# Patient Record
Sex: Male | Born: 1960 | Race: Black or African American | Hispanic: No | Marital: Single | Smoking: Current every day smoker
Health system: Southern US, Community
[De-identification: ages and names within clinical notes are randomized; demographics above are authoritative.]

## PROBLEM LIST (undated history)

## (undated) DIAGNOSIS — H5440 Blindness, one eye, unspecified eye: Secondary | ICD-10-CM

## (undated) DIAGNOSIS — Z970 Presence of artificial eye: Secondary | ICD-10-CM

## (undated) HISTORY — PX: OTHER SURGICAL HISTORY: SHX169

---

## 1997-09-16 ENCOUNTER — Other Ambulatory Visit: Admission: RE | Admit: 1997-09-16 | Discharge: 1997-09-16 | Payer: Self-pay | Admitting: Ophthalmology

## 2000-07-08 ENCOUNTER — Emergency Department (HOSPITAL_COMMUNITY): Admission: EM | Admit: 2000-07-08 | Discharge: 2000-07-08 | Payer: Self-pay | Admitting: Emergency Medicine

## 2000-08-11 ENCOUNTER — Emergency Department (HOSPITAL_COMMUNITY): Admission: EM | Admit: 2000-08-11 | Discharge: 2000-08-11 | Payer: Self-pay | Admitting: Emergency Medicine

## 2000-12-20 ENCOUNTER — Emergency Department (HOSPITAL_COMMUNITY): Admission: EM | Admit: 2000-12-20 | Discharge: 2000-12-20 | Payer: Self-pay | Admitting: Emergency Medicine

## 2001-06-15 ENCOUNTER — Emergency Department (HOSPITAL_COMMUNITY): Admission: EM | Admit: 2001-06-15 | Discharge: 2001-06-15 | Payer: Self-pay | Admitting: Emergency Medicine

## 2001-08-05 ENCOUNTER — Emergency Department (HOSPITAL_COMMUNITY): Admission: EM | Admit: 2001-08-05 | Discharge: 2001-08-05 | Payer: Self-pay

## 2001-08-10 ENCOUNTER — Emergency Department (HOSPITAL_COMMUNITY): Admission: EM | Admit: 2001-08-10 | Discharge: 2001-08-10 | Payer: Self-pay | Admitting: *Deleted

## 2010-06-12 ENCOUNTER — Emergency Department (HOSPITAL_COMMUNITY)
Admission: EM | Admit: 2010-06-12 | Discharge: 2010-06-12 | Payer: Self-pay | Source: Home / Self Care | Admitting: Emergency Medicine

## 2010-12-05 ENCOUNTER — Observation Stay (HOSPITAL_COMMUNITY)
Admission: EM | Admit: 2010-12-05 | Discharge: 2010-12-06 | Disposition: A | Discharge status: Home / Self Care | Payer: Self-pay | Attending: Ophthalmology | Admitting: Ophthalmology

## 2010-12-05 ENCOUNTER — Emergency Department (HOSPITAL_COMMUNITY): Payer: Self-pay

## 2010-12-05 DIAGNOSIS — Z01810 Encounter for preprocedural cardiovascular examination: Secondary | ICD-10-CM | POA: Insufficient documentation

## 2010-12-05 DIAGNOSIS — Z01818 Encounter for other preprocedural examination: Secondary | ICD-10-CM | POA: Insufficient documentation

## 2010-12-05 DIAGNOSIS — H31329 Choroidal rupture, unspecified eye: Secondary | ICD-10-CM | POA: Insufficient documentation

## 2010-12-05 DIAGNOSIS — W219XXA Striking against or struck by unspecified sports equipment, initial encounter: Secondary | ICD-10-CM | POA: Insufficient documentation

## 2010-12-05 DIAGNOSIS — T85398A Other mechanical complication of other ocular prosthetic devices, implants and grafts, initial encounter: Secondary | ICD-10-CM | POA: Insufficient documentation

## 2010-12-05 DIAGNOSIS — S0520XA Ocular laceration and rupture with prolapse or loss of intraocular tissue, unspecified eye, initial encounter: Principal | ICD-10-CM | POA: Insufficient documentation

## 2010-12-05 DIAGNOSIS — Y998 Other external cause status: Secondary | ICD-10-CM | POA: Insufficient documentation

## 2010-12-05 DIAGNOSIS — Y9364 Activity, baseball: Secondary | ICD-10-CM | POA: Insufficient documentation

## 2010-12-05 DIAGNOSIS — F17200 Nicotine dependence, unspecified, uncomplicated: Secondary | ICD-10-CM | POA: Insufficient documentation

## 2010-12-05 DIAGNOSIS — Z01812 Encounter for preprocedural laboratory examination: Secondary | ICD-10-CM | POA: Insufficient documentation

## 2010-12-05 LAB — CBC
HCT: 39.4 % (ref 39.0–52.0)
Hemoglobin: 14.5 g/dL (ref 13.0–17.0)
MCH: 33.9 pg (ref 26.0–34.0)
MCV: 92.1 fL (ref 78.0–100.0)
Platelets: 246 10*3/uL (ref 150–400)
RBC: 4.28 MIL/uL (ref 4.22–5.81)

## 2010-12-05 LAB — DIFFERENTIAL
Eosinophils Absolute: 0 10*3/uL (ref 0.0–0.7)
Lymphocytes Relative: 11 % — ABNORMAL LOW (ref 12–46)
Lymphs Abs: 1.3 10*3/uL (ref 0.7–4.0)
Monocytes Relative: 9 % (ref 3–12)
Neutrophils Relative %: 79 % — ABNORMAL HIGH (ref 43–77)

## 2010-12-05 LAB — POCT I-STAT, CHEM 8
BUN: 12 mg/dL (ref 6–23)
Calcium, Ion: 1.03 mmol/L — ABNORMAL LOW (ref 1.12–1.32)
Chloride: 104 mEq/L (ref 96–112)
Creatinine, Ser: 1.3 mg/dL (ref 0.50–1.35)
Glucose, Bld: 153 mg/dL — ABNORMAL HIGH (ref 70–99)
HCT: 46 % (ref 39.0–52.0)
Hemoglobin: 15.6 g/dL (ref 13.0–17.0)
Potassium: 4.1 mEq/L (ref 3.5–5.1)
Sodium: 136 mEq/L (ref 135–145)
TCO2: 18 mmol/L (ref 0–100)

## 2010-12-16 NOTE — Op Note (Signed)
  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  PROCEDURE PERFORMED:  Suturing of corneal transplant and repositing intraocular content composed of hemorrhagic choroidal.  SURGEON:  Shade Flood, MD.  ESTIMATED BLOOD LOSS:  Less than 1 mL.  There were no complications and no specimens for pathology.  The patient was prepared and draped in the usual fashion for ocular surgery on the left eye and a solid lid speculum was placed.  The patient was noted to have a three-quarter rupture of the corneal transplant with exposed iris and hemorrhagic choroidal tissue behind the flap of the cornea.  This was extruding through the opening in the host portion of the cornea.  Care was taken to gently reposit the choroidal detachment using a blunt instrumentation.  Then, the corneal graft was re-apposed with interrupted 10-0 nylon sutures.  Once sufficient sutures were placed to establish the anterior chamber, Viscoat viscoelastic was placed through the graft incision to deepen the chamber and separate the choroidal from the cornea.  There was no lost iris tissue evident and no lost retina tissue evident, though there was clearly a massive choroidal noted.  After completion of suturing of the corneal graft, subconjunctival Ancef was done with a 26-gauge needle with the needle under direct visualization, concentration delivered 100 mg.  The patient was also given posterior sub-Tenon triamcinolone acetate 40 mg.  The patient was given a  retrobulbar block of 4 mL 0.75% bupivacaine at the close of the procedure.  The eye was patched with TobraDex ointment and a metal shield was placed.  The patient was uneventfully extubated and transferred from the operating room to the postoperative recovery area.          ______________________________ Shade Flood, MD     GG/MEDQ  D:  12/06/2010  T:  12/06/2010  Job:  [id number]  Electronically Signed by Shade Flood MD on 12/16/2010 11:57:35 AM

## 2011-06-17 ENCOUNTER — Encounter (HOSPITAL_COMMUNITY): Payer: Self-pay

## 2011-06-17 ENCOUNTER — Emergency Department (HOSPITAL_COMMUNITY)
Admission: EM | Admit: 2011-06-17 | Discharge: 2011-06-17 | Disposition: A | Discharge status: Home / Self Care | Payer: Self-pay | Attending: Emergency Medicine | Admitting: Emergency Medicine

## 2011-06-17 DIAGNOSIS — T169XXA Foreign body in ear, unspecified ear, initial encounter: Secondary | ICD-10-CM | POA: Insufficient documentation

## 2011-06-17 DIAGNOSIS — H9209 Otalgia, unspecified ear: Secondary | ICD-10-CM | POA: Insufficient documentation

## 2011-06-17 DIAGNOSIS — IMO0002 Reserved for concepts with insufficient information to code with codable children: Secondary | ICD-10-CM | POA: Insufficient documentation

## 2011-06-17 DIAGNOSIS — T162XXA Foreign body in left ear, initial encounter: Secondary | ICD-10-CM

## 2011-06-17 MED ORDER — NEOMYCIN-POLYMYXIN-HC 3.5-10000-1 OT SUSP: 4.0000 [drp] | Freq: Three times a day (TID) | OTIC | Status: AC

## 2011-06-17 NOTE — ED Provider Notes (Signed)
History     CSN: [id number]  Arrival date & time 06/17/11  4098   First MD Initiated Contact with Patient 06/17/11 [phone]      Chief Complaint  Patient presents with  . Otalgia    (Consider location/radiation/quality/duration/timing/severity/associated sxs/prior treatment) HPI Comments: Pt was using a q tip to clean his ear and since then he has felt like a portion of the cotton has been in his ear.  When he takes a shower that left ear stays wet.  Patient is a 51 y.o. male presenting with ear pain. The history is provided by the patient.  Otalgia This is a new problem. Episode onset: several days ago. There is pain in the left ear. The problem occurs constantly. The problem has not changed since onset.There has been no fever. The patient is experiencing no pain. Pertinent negatives include no ear discharge, no hearing loss, no rhinorrhea and no cough.    History reviewed. No pertinent past medical history.  Past Surgical History  Procedure Date  . Ruptured eye     eye consultants have done surgery on the left eye.     History reviewed. No pertinent family history.  History  Substance Use Topics  . Smoking status: Current Some Day Smoker  . Smokeless tobacco: Not on file  . Alcohol Use: Yes      Review of Systems  HENT: Positive for ear pain. Negative for hearing loss, rhinorrhea and ear discharge.   Respiratory: Negative for cough.   All other systems reviewed and are negative.    Allergies  Review of patient's allergies indicates no known allergies.  Home Medications  No current outpatient prescriptions on file.  BP 154/84  Pulse 64  Temp(Src) 97.7 F (36.5 C) (Oral)  Resp 20  SpO2 100%  Physical Exam  Nursing note and vitals reviewed. Constitutional: He appears well-developed and well-nourished. No distress.  HENT:  Head: Normocephalic and atraumatic.  Right Ear: External ear normal.       White cotton fb left ear  Eyes: Right eye exhibits no  discharge. Left eye exhibits no discharge. No scleral icterus.  Neck: Neck supple. No tracheal deviation present.  Cardiovascular: Normal rate.   Pulmonary/Chest: Effort normal. No stridor. No respiratory distress.  Musculoskeletal: He exhibits no edema.  Neurological: He is alert. Cranial nerve deficit: no gross deficits.  Skin: Skin is warm and dry. No rash noted.  Psychiatric: He has a normal mood and affect.    ED Course  FOREIGN BODY REMOVAL Performed by: Linwood Dibbles R Authorized by: Linwood Dibbles R Consent: Verbal consent obtained. Written consent not obtained. Risks and benefits: risks, benefits and alternatives were discussed Consent given by: patient Time out: Immediately prior to procedure a "time out" was called to verify the correct patient, procedure, equipment, support staff and site/side marked as required. Body area: ear Location details: left ear Patient sedated: no Patient restrained: no Patient cooperative: yes Localization method: ENT speculum and visualized Removal mechanism: ear scoop and alligator forceps Complexity: complex 1 objects recovered. Objects recovered: q tip cotton swab Post-procedure assessment: foreign body removed Patient tolerance: Patient tolerated the procedure well with no immediate complications.   (including critical care time)  Labs Reviewed - No data to display No results found.   1. Foreign body of left ear       MDM  Q tip removed.  Several attempts were necessary.  No bleeding noted post removal.  abx drops to the canal post removal.  Celene Kras, MD 06/17/11 [phone]

## 2011-06-17 NOTE — ED Notes (Signed)
Pt was using q-tips and he thinks that part of it broke off into his left ear. Pt is having pain in the left ear. He has tried to place oil and use tweezers to get it out with no relief.

## 2011-06-17 NOTE — ED Notes (Signed)
Alligator forceps at pt bedside.

## 2011-06-17 NOTE — ED Notes (Signed)
Dr. Lynelle Doctor at bedside for FB removal from left ear.

## 2011-11-02 ENCOUNTER — Emergency Department (HOSPITAL_COMMUNITY)
Admission: EM | Admit: 2011-11-02 | Discharge: 2011-11-02 | Disposition: A | Discharge status: Home / Self Care | Payer: Self-pay | Attending: Emergency Medicine | Admitting: Emergency Medicine

## 2011-11-02 ENCOUNTER — Encounter (HOSPITAL_COMMUNITY): Payer: Self-pay | Admitting: Emergency Medicine

## 2011-11-02 DIAGNOSIS — H02829 Cysts of unspecified eye, unspecified eyelid: Secondary | ICD-10-CM | POA: Insufficient documentation

## 2011-11-02 DIAGNOSIS — H579 Unspecified disorder of eye and adnexa: Secondary | ICD-10-CM | POA: Insufficient documentation

## 2011-11-02 DIAGNOSIS — Z9689 Presence of other specified functional implants: Secondary | ICD-10-CM | POA: Insufficient documentation

## 2011-11-02 DIAGNOSIS — F17200 Nicotine dependence, unspecified, uncomplicated: Secondary | ICD-10-CM | POA: Insufficient documentation

## 2011-11-02 NOTE — ED Notes (Signed)
PT presents with small bump on left eyelid that has no drainage; been there since March; not painful.

## 2011-11-02 NOTE — ED Provider Notes (Signed)
History     CSN: [id number]  Arrival date & time 11/02/11  9562   First MD Initiated Contact with Patient 11/02/11 [phone]      Chief Complaint  Patient presents with  . Eye Problem    (Consider location/radiation/quality/duration/timing/severity/associated sxs/prior treatment) HPI Comments: Patient has a nontender nodule to the left upper eyelid for the past 3 months. She had a globe rupture of this eye back in March and has a prosthetic eye that was placed by Dr. Clarisa Kindred. His right eye vision is normal. He denies any fever, vomiting, drainage, bleeding. The bump is at times tender in varies in size. It is not involving the lid margin.  The history is provided by the patient.    No past medical history on file.  Past Surgical History  Procedure Date  . Ruptured eye     eye consultants have done surgery on the left eye.     No family history on file.  History  Substance Use Topics  . Smoking status: Current Some Day Smoker  . Smokeless tobacco: Not on file  . Alcohol Use: Yes      Review of Systems  HENT: Negative for congestion and rhinorrhea.   Eyes: Negative for discharge and visual disturbance.  Respiratory: Negative for cough, chest tightness and shortness of breath.   Cardiovascular: Negative for chest pain.  Gastrointestinal: Negative for nausea, vomiting and abdominal pain.  Genitourinary: Negative for dysuria and hematuria.  Musculoskeletal: Negative for back pain.  Neurological: Negative for headaches.    Allergies  Review of patient's allergies indicates no known allergies.  Home Medications  No current outpatient prescriptions on file.  BP 130/80  Pulse 78  Temp 98.4 F (36.9 C) (Oral)  Resp 16  SpO2 99%  Physical Exam  Constitutional: He is oriented to person, place, and time. He appears well-developed and well-nourished. No distress.  HENT:  Head: Normocephalic and atraumatic.  Mouth/Throat: Oropharynx is clear and moist. No oropharyngeal  exudate.  Eyes: Conjunctivae and EOM are normal. Pupils are equal, round, and reactive to light.       0.5 cm nontender nodule to L upper lid.  Not involving lid margin.  No fluctuance or erythema  Neck: Normal range of motion. Neck supple.  Cardiovascular: Normal rate, regular rhythm and normal heart sounds.   No murmur heard. Pulmonary/Chest: Effort normal and breath sounds normal. No respiratory distress.  Abdominal: Soft. There is no tenderness. There is no rebound and no guarding.  Musculoskeletal: Normal range of motion. He exhibits no edema and no tenderness.  Neurological: He is alert and oriented to person, place, and time. No cranial nerve deficit.  Skin: Skin is warm.    ED Course  Procedures (including critical care time)  Labs Reviewed - No data to display No results found.   No diagnosis found.    MDM  Nodule to L upper eyelid, likely cyst.  No evidence of fluctuance or abscess. No change in vision.   Warm compresses, follow up ophthalmologist      Glynn Octave, MD 11/02/11 [phone]

## 2011-11-02 NOTE — ED Notes (Signed)
PT states he can not eye anything out of his LEFT eye. Everything is black.

## 2011-11-02 NOTE — Discharge Instructions (Signed)
You have a cyst in your eyelid that needs to be removed by a specialist.  Follow up with the ophthalmologist.  Return to the ED if you develop new or worsening symptoms.

## 2011-11-02 NOTE — ED Notes (Signed)
Pt reports bump on left eyelid since March. States had consult with eye doctor, told he needed to have it drained, but could not afford it. Reports intermittant pain.

## 2012-02-05 ENCOUNTER — Emergency Department (HOSPITAL_COMMUNITY)
Admission: EM | Admit: 2012-02-05 | Discharge: 2012-02-05 | Disposition: A | Discharge status: Home / Self Care | Payer: Self-pay | Attending: Emergency Medicine | Admitting: Emergency Medicine

## 2012-02-05 ENCOUNTER — Encounter (HOSPITAL_COMMUNITY): Payer: Self-pay | Admitting: Emergency Medicine

## 2012-02-05 DIAGNOSIS — R42 Dizziness and giddiness: Secondary | ICD-10-CM | POA: Insufficient documentation

## 2012-02-05 DIAGNOSIS — F17200 Nicotine dependence, unspecified, uncomplicated: Secondary | ICD-10-CM | POA: Insufficient documentation

## 2012-02-05 LAB — CBC WITH DIFFERENTIAL/PLATELET
Eosinophils Absolute: 0.4 10*3/uL (ref 0.0–0.7)
Eosinophils Relative: 7 % — ABNORMAL HIGH (ref 0–5)
HCT: 45 % (ref 39.0–52.0)
Hemoglobin: 15.9 g/dL (ref 13.0–17.0)
Lymphocytes Relative: 32 % (ref 12–46)
Lymphs Abs: 1.8 10*3/uL (ref 0.7–4.0)
MCH: 33.8 pg (ref 26.0–34.0)
MCV: 95.7 fL (ref 78.0–100.0)
Monocytes Absolute: 0.6 10*3/uL (ref 0.1–1.0)
Monocytes Relative: 11 % (ref 3–12)
RBC: 4.7 MIL/uL (ref 4.22–5.81)

## 2012-02-05 LAB — BASIC METABOLIC PANEL
BUN: 8 mg/dL (ref 6–23)
CO2: 24 mEq/L (ref 19–32)
Calcium: 9.7 mg/dL (ref 8.4–10.5)
GFR calc non Af Amer: >90 mL/min (ref >90)
Glucose, Bld: 104 mg/dL — ABNORMAL HIGH (ref 70–99)

## 2012-02-05 LAB — URINALYSIS, ROUTINE W REFLEX MICROSCOPIC
Bilirubin Urine: NEGATIVE
Glucose, UA: NEGATIVE mg/dL
Hgb urine dipstick: NEGATIVE
Protein, ur: NEGATIVE mg/dL
Urobilinogen, UA: 0.2 mg/dL (ref 0.0–1.0)

## 2012-02-05 MED ORDER — LORAZEPAM 1 MG PO TABS
0.5000 mg | ORAL_TABLET | Freq: Once | ORAL | Status: AC
  Administered 2012-02-05: 0.5 mg via ORAL
  Filled 2012-02-05: qty 1

## 2012-02-05 MED ORDER — MECLIZINE HCL 25 MG PO TABS: 25.0000 mg | ORAL_TABLET | Freq: Four times a day (QID) | ORAL | Status: DC | PRN

## 2012-02-05 MED ORDER — MECLIZINE HCL 25 MG PO TABS
25.0000 mg | ORAL_TABLET | Freq: Once | ORAL | Status: AC
  Administered 2012-02-05: 25 mg via ORAL
  Filled 2012-02-05: qty 1

## 2012-02-05 NOTE — ED Notes (Signed)
Patient is alert and orientedx4.  Patient is being discharged and all instructions were explained and he had no questions.  Patient has a friend coming to transport him home.

## 2012-02-05 NOTE — ED Notes (Signed)
Patient says he has been dizzy for like a week and a half and he says that he is just tired of being dizzy.  Patient says he drinks everyday and he has not drank today but he feels like he has been drinking.

## 2012-02-05 NOTE — ED Provider Notes (Signed)
History     CSN: [id number]  Arrival date & time 02/05/12  1516   First MD Initiated Contact with Patient 02/05/12 1741      Chief Complaint  Patient presents with  . Dizziness    (Consider location/radiation/quality/duration/timing/severity/associated sxs/prior treatment) HPI... dizziness and lightheadedness for approximately 10 days. No frank neuro deficits. No fever, sweats, chills, stiff neck.  Patient claims to be healthy.  Severity is mild to moderate.  Nothing makes symptoms better or worse  History reviewed. No pertinent past medical history.  Past Surgical History  Procedure Date  . Ruptured eye     eye consultants have done surgery on the left eye.     History reviewed. No pertinent family history.  History  Substance Use Topics  . Smoking status: Current Some Day Smoker  . Smokeless tobacco: Not on file  . Alcohol Use: Yes      Review of Systems  All other systems reviewed and are negative.    Allergies  Review of patient's allergies indicates no known allergies.  Home Medications   Current Outpatient Rx  Name Route Sig Dispense Refill  . ACETAMINOPHEN 325 MG PO TABS Oral Take 650 mg by mouth every 6 (six) hours as needed. For pain    . MECLIZINE HCL 25 MG PO TABS Oral Take 1 tablet (25 mg total) by mouth every 6 (six) hours as needed for dizziness. 20 tablet 0    BP 108/80  Pulse 76  Temp 98.3 F (36.8 C) (Oral)  Resp 20  SpO2 99%  Physical Exam  Nursing note and vitals reviewed. Constitutional: He is oriented to person, place, and time. He appears well-developed and well-nourished.  HENT:  Head: Normocephalic and atraumatic.  Eyes: Conjunctivae normal and EOM are normal. Pupils are equal, round, and reactive to light.  Neck: Normal range of motion. Neck supple.  Cardiovascular: Normal rate, regular rhythm and normal heart sounds.   Pulmonary/Chest: Effort normal and breath sounds normal.  Abdominal: Soft. Bowel sounds are normal.    Musculoskeletal: Normal range of motion.  Neurological: He is alert and oriented to person, place, and time.  Skin: Skin is warm and dry.  Psychiatric: He has a normal mood and affect.    ED Course  Procedures (including critical care time)  Labs Reviewed  CBC WITH DIFFERENTIAL - Abnormal; Notable for the following:    Eosinophils Relative 7 (*)     All other components within normal limits  BASIC METABOLIC PANEL - Abnormal; Notable for the following:    Glucose, Bld 104 (*)     All other components within normal limits  URINALYSIS, ROUTINE W REFLEX MICROSCOPIC - Abnormal; Notable for the following:    APPearance CLOUDY (*)     All other components within normal limits   No results found.   1. Vertigo       MDM  Physical exam grossly normal. Discharged home on Antivert.  Patient will return if worse        Donnetta Hutching, MD 02/05/12 2122

## 2012-02-05 NOTE — ED Notes (Signed)
Pt c/o dizziness and lightheadedness x 1.5 weeks

## 2013-12-27 ENCOUNTER — Emergency Department (HOSPITAL_COMMUNITY)
Admission: EM | Admit: 2013-12-27 | Discharge: 2013-12-27 | Disposition: A | Discharge status: Home / Self Care | Payer: Self-pay | Attending: Emergency Medicine | Admitting: Emergency Medicine

## 2013-12-27 ENCOUNTER — Encounter (HOSPITAL_COMMUNITY): Payer: Self-pay | Admitting: Emergency Medicine

## 2013-12-27 DIAGNOSIS — T169XXA Foreign body in ear, unspecified ear, initial encounter: Secondary | ICD-10-CM | POA: Insufficient documentation

## 2013-12-27 DIAGNOSIS — H61891 Other specified disorders of right external ear: Secondary | ICD-10-CM

## 2013-12-27 DIAGNOSIS — IMO0002 Reserved for concepts with insufficient information to code with codable children: Secondary | ICD-10-CM | POA: Insufficient documentation

## 2013-12-27 DIAGNOSIS — Y929 Unspecified place or not applicable: Secondary | ICD-10-CM | POA: Insufficient documentation

## 2013-12-27 DIAGNOSIS — Y9389 Activity, other specified: Secondary | ICD-10-CM | POA: Insufficient documentation

## 2013-12-27 DIAGNOSIS — F17200 Nicotine dependence, unspecified, uncomplicated: Secondary | ICD-10-CM | POA: Insufficient documentation

## 2013-12-27 DIAGNOSIS — Z0389 Encounter for observation for other suspected diseases and conditions ruled out: Secondary | ICD-10-CM | POA: Insufficient documentation

## 2013-12-27 NOTE — ED Notes (Signed)
Sanders, PA at bedside for evaluation. 

## 2013-12-27 NOTE — ED Provider Notes (Signed)
CSN: [id number]     Arrival date & time 12/27/13  [id number] History   First MD Initiated Contact with Patient 12/27/13 0747     Chief Complaint  Patient presents with  . Foreign Body in Ear     (Consider location/radiation/quality/duration/timing/severity/associated sxs/prior Treatment) The history is provided by the patient and medical records.   This is a 53 y.o. M presenting to the ED for a retained FB in right ear canal.  Patient states he was cleaning his ears after he showered last night and states the cotton end of the q-tip was missing when he pulled it out of his ear, he is concerned it may be stuck in his right ear.  Patient states his ear feels normal, hearing normal but he just wants it checked.  History reviewed. No pertinent past medical history. Past Surgical History  Procedure Laterality Date  . Ruptured eye      eye consultants have done surgery on the left eye.    No family history on file. History  Substance Use Topics  . Smoking status: Current Some Day Smoker  . Smokeless tobacco: Not on file  . Alcohol Use: Yes    Review of Systems  HENT:       FB sensation in right ear  All other systems reviewed and are negative.     Allergies  Review of patient's allergies indicates no known allergies.  Home Medications   Prior to Admission medications   Medication Sig Start Date End Date Taking? Authorizing Provider  acetaminophen (TYLENOL) 325 MG tablet Take 650 mg by mouth every 6 (six) hours as needed. For pain    Historical Provider, MD  meclizine (ANTIVERT) 25 MG tablet Take 1 tablet (25 mg total) by mouth every 6 (six) hours as needed for dizziness. 02/05/12   Donnetta HutchingBrian Cook, MD   BP 135/80  Pulse 62  Temp(Src) 97.8 F (36.6 C) (Oral)  Resp 18  SpO2 100%  Physical Exam  Nursing note and vitals reviewed. Constitutional: He is oriented to person, place, and time. He appears well-developed and well-nourished. No distress.  HENT:  Head: Normocephalic and  atraumatic.  Right Ear: Tympanic membrane and ear canal normal. No foreign bodies.  Left Ear: Tympanic membrane and ear canal normal.  Nose: Nose normal.  Mouth/Throat: Oropharynx is clear and moist.  No FB visible in right ear canal, small amount of wax but TM clearly visible and is intact Left ear WNL  Eyes: Conjunctivae and EOM are normal. Pupils are equal, round, and reactive to light.  Neck: Normal range of motion. Neck supple.  Cardiovascular: Normal rate, regular rhythm and normal heart sounds.   Pulmonary/Chest: Effort normal and breath sounds normal. No respiratory distress. He has no wheezes.  Musculoskeletal: Normal range of motion. He exhibits no edema.  Neurological: He is alert and oriented to person, place, and time.  Skin: Skin is warm and dry. He is not diaphoretic.  Psychiatric: He has a normal mood and affect.    ED Course  Procedures (including critical care time) Labs Review Labs Reviewed - No data to display  Imaging Review No results found.   EKG Interpretation None      MDM   Final diagnoses:  Foreign body sensation in ear canal, right   No FB visible in either ear canal, TMs clearly visible and are intact.  Patient discharged home in stable condition.  Garlon HatchetLisa M Sanders, PA-C 12/27/13 [phone]

## 2013-12-27 NOTE — Discharge Instructions (Signed)
Avoid sticking q-tip in your ears, this can damage your ear-drum. Return to the ED for new concerns.

## 2013-12-27 NOTE — ED Notes (Signed)
Patient states was cleaning ear with qtip yesterday and part of it was left in ear.

## 2013-12-28 NOTE — ED Provider Notes (Signed)
Medical screening examination/treatment/procedure(s) were performed by non-physician practitioner and as supervising physician I was immediately available for consultation/collaboration.  Audree CamelScott T Kazuki Ingle, MD 12/28/13 [phone]

## 2014-07-11 ENCOUNTER — Encounter (HOSPITAL_COMMUNITY): Payer: Self-pay | Admitting: Emergency Medicine

## 2014-07-11 ENCOUNTER — Emergency Department (HOSPITAL_COMMUNITY)
Admission: EM | Admit: 2014-07-11 | Discharge: 2014-07-11 | Disposition: A | Discharge status: Home / Self Care | Payer: Self-pay | Attending: Emergency Medicine | Admitting: Emergency Medicine

## 2014-07-11 DIAGNOSIS — L749 Eccrine sweat disorder, unspecified: Secondary | ICD-10-CM | POA: Insufficient documentation

## 2014-07-11 DIAGNOSIS — Z720 Tobacco use: Secondary | ICD-10-CM | POA: Insufficient documentation

## 2014-07-11 DIAGNOSIS — L02411 Cutaneous abscess of right axilla: Secondary | ICD-10-CM | POA: Insufficient documentation

## 2014-07-11 DIAGNOSIS — L748 Other eccrine sweat disorders: Secondary | ICD-10-CM

## 2014-07-11 MED ORDER — SULFAMETHOXAZOLE-TRIMETHOPRIM 800-160 MG PO TABS
1.0000 | ORAL_TABLET | Freq: Once | ORAL | Status: AC
  Administered 2014-07-11: 1 via ORAL
  Filled 2014-07-11: qty 1

## 2014-07-11 MED ORDER — SULFAMETHOXAZOLE-TRIMETHOPRIM 800-160 MG PO TABS: 1.0000 | ORAL_TABLET | Freq: Two times a day (BID) | ORAL | Status: DC

## 2014-07-11 MED ORDER — SULFAMETHOXAZOLE-TRIMETHOPRIM 200-40 MG/5ML PO SUSP: 20.0000 mL | Freq: Once | ORAL | Status: DC

## 2014-07-11 MED ORDER — LIDOCAINE-EPINEPHRINE (PF) 2 %-1:200000 IJ SOLN
20.0000 mL | Freq: Once | INTRAMUSCULAR | Status: AC
  Administered 2014-07-11: 20 mL via INTRADERMAL
  Filled 2014-07-11: qty 20

## 2014-07-11 NOTE — ED Notes (Signed)
Patient with three areas of tenderness with pus coming from them.  They are tender to the touch, warm to the touch.  All three are in the right axilla.

## 2014-07-11 NOTE — ED Provider Notes (Signed)
CSN: [id number]     Arrival date & time 07/11/14  [id number] History   First MD Initiated Contact with Patient 07/11/14 [phone]     Chief Complaint  Patient presents with  . Abscess     (Consider location/radiation/quality/duration/timing/severity/associated sxs/prior Treatment) HPI   Gary Gill is a 54 y.o. male complaining of 3 painful lesions to right axilla worsening over the course of 2 days. Patient has been "trying to mash" done), now. He's had no prior similar episodes. He denies fever, chills, nausea, vomiting, history of diabetes or HIV. Pain is severe, 8 out of 10, no pain medication taken prior to arrival. He states the pain is exacerbated by movement and palpation.  History reviewed. No pertinent past medical history. Past Surgical History  Procedure Laterality Date  . Ruptured eye      eye consultants have done surgery on the left eye.    No family history on file. History  Substance Use Topics  . Smoking status: Current Some Day Smoker  . Smokeless tobacco: Not on file  . Alcohol Use: Yes    Review of Systems  10 systems reviewed and found to be negative, except as noted in the HPI.   Allergies  Review of patient's allergies indicates no known allergies.  Home Medications   Prior to Admission medications   Medication Sig Start Date End Date Taking? Authorizing Provider  acetaminophen (TYLENOL) 325 MG tablet Take 650 mg by mouth every 6 (six) hours as needed. For pain    Historical Provider, MD  meclizine (ANTIVERT) 25 MG tablet Take 1 tablet (25 mg total) by mouth every 6 (six) hours as needed for dizziness. 02/05/12   Donnetta HutchingBrian Cook, MD   BP 111/65 mmHg  Pulse 75  Temp(Src) 98 F (36.7 C) (Oral)  Resp 16  Ht 5\' 7"  (1.702 m)  SpO2 100% Physical Exam  Constitutional: He is oriented to person, place, and time. He appears well-developed and well-nourished. No distress.  HENT:  Head: Normocephalic and atraumatic.  Mouth/Throat: Oropharynx is clear and moist.   Eyes: Conjunctivae and EOM are normal.  Cardiovascular: Normal rate, regular rhythm and intact distal pulses.   Pulmonary/Chest: Effort normal. No stridor.  Musculoskeletal: Normal range of motion.  Neurological: He is alert and oriented to person, place, and time.  Skin:  2 fluctuant abscesses to right axilla 1 actively draining, one with no fluctuance.  Psychiatric: He has a normal mood and affect.  Nursing note and vitals reviewed.   ED Course  INCISION AND DRAINAGE Date/Time: 07/11/2014 7:59 AM Performed by: Wynetta EmeryPISCIOTTA, Raine Blodgett Authorized by: Wynetta EmeryPISCIOTTA, Netanel Yannuzzi Consent: Verbal consent obtained. Consent given by: patient Patient identity confirmed: verbally with patient Type: abscess Location: Right axilla. Anesthesia: local infiltration Local anesthetic: lidocaine 2% with epinephrine Anesthetic total: 1.5 ml Patient sedated: no Scalpel size: 11 Incision type: single straight Drainage: purulent Drainage amount: scant Wound treatment: wound left open Packing material: none Patient tolerance: Patient tolerated the procedure well with no immediate complications    INCISION AND DRAINAGE Date/Time: 07/11/2014 7:59 AM Performed by: Wynetta EmeryPISCIOTTA, Kalanie Fewell Authorized by: Wynetta EmeryPISCIOTTA, Trevar Boehringer Consent: Verbal consent obtained. Consent given by: patient Patient identity confirmed: verbally with patient Type: abscess Location: Right axilla. Anesthesia: local infiltration Local anesthetic: lidocaine 2% with epinephrine Anesthetic total: 1.5 ml Patient sedated: no Scalpel size: 11 Incision type: single straight Drainage: purulent Drainage amount: scant Wound treatment: wound left open Packing material: none Patient tolerance: Patient tolerated the procedure well with no immediate complications  Labs Review Labs  Reviewed - No data to display  Imaging Review No results found.   EKG Interpretation None      MDM   Final diagnoses:  None   Filed Vitals:   07/11/14 0649   BP: 111/65  Pulse: 75  Temp: 98 F (36.7 C)  TempSrc: Oral  Resp: 16  Height:  (1.702 m)  SpO2: 100%    Medications  lidocaine-EPINEPHrine (XYLOCAINE W/EPI) 2 %-1:200000 (PF) injection 20 mL (not administered)  sulfamethoxazole-trimethoprim (BACTRIM DS,SEPTRA DS) 800-160 MG per tablet 1 tablet (not administered)    Gary Gill is a pleasant 54 y.o. male presenting with several abscesses to right axilla, no signs of systemic infection. To the more I indeed with scant drainage. Patient advised wound care. Will start on antibiotics, advised warm compresses.  Evaluation does not show pathology that would require ongoing emergent intervention or inpatient treatment. Pt is hemodynamically stable and mentating appropriately. Discussed findings and plan with patient/guardian, who agrees with care plan. All questions answered. Return precautions discussed and outpatient follow up given.   New Prescriptions   SULFAMETHOXAZOLE-TRIMETHOPRIM (BACTRIM DS) 800-160 MG PER TABLET    Take 1 tablet by mouth 2 (two) times daily.         Wynetta Emery, PA-C 07/11/14 0801  Nelia Shi, MD 07/11/14 [phone]

## 2014-07-11 NOTE — Discharge Instructions (Signed)
If you see signs of infection (warmth, redness, tenderness, pus, sharp increase in pain, fever, red streaking) immediately return to the emergency department.  Take your antibiotics as directed and to completion. You should never have any leftover antibiotics! Push fluids and stay well hydrated.    If you develop fever, have vomiting or if the swelling and redness starts spreading , return to the emergency room immediately for a recheck.

## 2016-06-19 ENCOUNTER — Encounter (HOSPITAL_COMMUNITY): Payer: Self-pay

## 2016-06-19 ENCOUNTER — Emergency Department (HOSPITAL_COMMUNITY): Payer: No Typology Code available for payment source

## 2016-06-19 ENCOUNTER — Emergency Department (HOSPITAL_COMMUNITY)
Admission: EM | Admit: 2016-06-19 | Discharge: 2016-06-19 | Disposition: A | Discharge status: Home / Self Care | Payer: No Typology Code available for payment source | Attending: Emergency Medicine | Admitting: Emergency Medicine

## 2016-06-19 DIAGNOSIS — S199XXA Unspecified injury of neck, initial encounter: Secondary | ICD-10-CM | POA: Diagnosis present

## 2016-06-19 DIAGNOSIS — S39012A Strain of muscle, fascia and tendon of lower back, initial encounter: Secondary | ICD-10-CM | POA: Diagnosis not present

## 2016-06-19 DIAGNOSIS — Y939 Activity, unspecified: Secondary | ICD-10-CM | POA: Diagnosis not present

## 2016-06-19 DIAGNOSIS — Y999 Unspecified external cause status: Secondary | ICD-10-CM | POA: Insufficient documentation

## 2016-06-19 DIAGNOSIS — S161XXA Strain of muscle, fascia and tendon at neck level, initial encounter: Secondary | ICD-10-CM

## 2016-06-19 DIAGNOSIS — Z79899 Other long term (current) drug therapy: Secondary | ICD-10-CM | POA: Insufficient documentation

## 2016-06-19 DIAGNOSIS — Y92410 Unspecified street and highway as the place of occurrence of the external cause: Secondary | ICD-10-CM | POA: Diagnosis not present

## 2016-06-19 DIAGNOSIS — F17200 Nicotine dependence, unspecified, uncomplicated: Secondary | ICD-10-CM | POA: Diagnosis not present

## 2016-06-19 HISTORY — DX: Presence of artificial eye: Z97.0

## 2016-06-19 HISTORY — DX: Blindness, one eye, unspecified eye: H54.40

## 2016-06-19 MED ORDER — DICLOFENAC SODIUM 50 MG PO TBEC: 50.0000 mg | DELAYED_RELEASE_TABLET | Freq: Two times a day (BID) | ORAL | 0 refills | Status: DC

## 2016-06-19 MED ORDER — CYCLOBENZAPRINE HCL 10 MG PO TABS: 10.0000 mg | ORAL_TABLET | Freq: Two times a day (BID) | ORAL | 0 refills | Status: DC | PRN

## 2016-06-19 MED ORDER — CYCLOBENZAPRINE HCL 10 MG PO TABS
10.0000 mg | ORAL_TABLET | Freq: Once | ORAL | Status: AC
  Administered 2016-06-19: 10 mg via ORAL
  Filled 2016-06-19: qty 1

## 2016-06-19 MED ORDER — IBUPROFEN 400 MG PO TABS
600.0000 mg | ORAL_TABLET | Freq: Once | ORAL | Status: AC
  Administered 2016-06-19: 600 mg via ORAL
  Filled 2016-06-19: qty 1

## 2016-06-19 NOTE — ED Notes (Signed)
Pt verbalized understanding discharge instructions and denies any further needs or questions at this time. VS stable, ambulatory and steady gait.   
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
## 2016-06-19 NOTE — ED Notes (Signed)
Signature pad broken and pt in a rush.

## 2017-02-23 ENCOUNTER — Encounter (INDEPENDENT_AMBULATORY_CARE_PROVIDER_SITE_OTHER): Payer: Self-pay | Admitting: Physician Assistant

## 2017-02-23 ENCOUNTER — Ambulatory Visit (INDEPENDENT_AMBULATORY_CARE_PROVIDER_SITE_OTHER): Payer: Medicaid Other | Admitting: Physician Assistant

## 2017-02-23 VITALS — BP 115/75 | HR 90 | Temp 97.6°F | Wt 159.6 lb

## 2017-02-23 DIAGNOSIS — L989 Disorder of the skin and subcutaneous tissue, unspecified: Secondary | ICD-10-CM | POA: Diagnosis not present

## 2017-02-23 DIAGNOSIS — Z23 Encounter for immunization: Secondary | ICD-10-CM | POA: Diagnosis not present

## 2017-02-23 DIAGNOSIS — N50811 Right testicular pain: Secondary | ICD-10-CM | POA: Diagnosis not present

## 2017-02-23 DIAGNOSIS — Z1159 Encounter for screening for other viral diseases: Secondary | ICD-10-CM

## 2017-02-23 DIAGNOSIS — Z114 Encounter for screening for human immunodeficiency virus [HIV]: Secondary | ICD-10-CM

## 2017-02-23 MED ORDER — CIPROFLOXACIN HCL 500 MG PO TABS: 500.0000 mg | ORAL_TABLET | Freq: Two times a day (BID) | ORAL | 0 refills | Status: DC

## 2017-02-23 MED ORDER — NAPROXEN 500 MG PO TABS: 500.0000 mg | ORAL_TABLET | Freq: Two times a day (BID) | ORAL | 0 refills | Status: DC

## 2017-02-23 NOTE — Progress Notes (Signed)
Subjective:  Patient ID: Gary Gill, male    DOB: 1961-04-12  Age: 56 y.o. MRN: [id number]  CC: groin pain  HPI Gary Gill is a 56 y.o. male with a medical history of blind left eye s/p eye prosthesis presents as a new patient with complaint of pruritic lesions on back of head, chest, and left thigh. Onset one year ago. Has used several OTC creams including hydrocortisone without relief.    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 Right testicle is very tender to any kind of contact. Pain radiates up the spermatic cord.  Does not endorse trauma, swelling, redness, urinary symptoms, or f/c/n/v.      Outpatient Medications Prior to Visit  Medication Sig Dispense Refill  . acetaminophen (TYLENOL) 325 MG tablet Take 650 mg by mouth every 6 (six) hours as needed. For pain    . cyclobenzaprine (FLEXERIL) 10 MG tablet Take 1 tablet (10 mg total) by mouth 2 (two) times daily as needed for muscle spasms. (Patient not taking: Reported on 02/23/2017) 20 tablet 0  . diclofenac (VOLTAREN) 50 MG EC tablet Take 1 tablet (50 mg total) by mouth 2 (two) times daily. (Patient not taking: Reported on 02/23/2017) 15 tablet 0  . meclizine (ANTIVERT) 25 MG tablet Take 1 tablet (25 mg total) by mouth every 6 (six) hours as needed for dizziness. (Patient not taking: Reported on 02/23/2017) 20 tablet 0  . sulfamethoxazole-trimethoprim (BACTRIM DS) 800-160 MG per tablet Take 1 tablet by mouth 2 (two) times daily. (Patient not taking: Reported on 02/23/2017) 20 tablet 0   No facility-administered medications prior to visit.      ROS Review of Systems  Constitutional: Negative for chills, fever and malaise/fatigue.  Eyes: Negative for blurred vision.  Respiratory: Negative for shortness of breath.   Cardiovascular: Negative for chest pain and palpitations.  Gastrointestinal: Negative for abdominal pain and nausea.  Genitourinary:  Negative for dysuria and hematuria.       Testicular pain  Musculoskeletal: Negative for joint pain and myalgias.  Skin: Positive for rash.  Neurological: Negative for tingling and headaches.  Psychiatric/Behavioral: Negative for depression. The patient is not nervous/anxious.     Objective:  BP 115/75 (BP Location: Left Arm, Patient Position: Sitting, Cuff Size: Normal)   Pulse 90   Temp 97.6 F (36.4 C) (Oral)   Wt 159 lb 9.6 oz (72.4 kg)   SpO2 96%   BMI 25.00 kg/m   BP/Weight 02/23/2017 06/19/2016 07/11/2014  Systolic BP 115 136 131  Diastolic BP 75 86 82  Wt. (Lbs) 159.6 160 -  BMI 25 25.06 -      Physical Exam  Constitutional: He is oriented to person, place, and time.  Well developed, central obesity, NAD, polite  HENT:  Head: Normocephalic and atraumatic.  Eyes: Conjunctivae are normal. No scleral icterus.  Neck: Normal range of motion. Neck supple. No thyromegaly present.  Cardiovascular: Normal rate, regular rhythm and normal heart sounds.   Pulmonary/Chest: Effort normal and breath sounds normal.  Abdominal: Soft. Bowel sounds are normal. There is no tenderness.  Genitourinary:  Genitourinary Comments: Normal appearance of testicles. Exquisite tenderness of the right testicle and spermatic cord. No inguinal hernia.   Musculoskeletal: He exhibits no edema.  Neurological: He is alert and oriented to person, place, and time. No cranial nerve deficit. Coordination normal.  Skin: Skin is warm and dry. No rash noted. No erythema. No pallor.  Psychiatric: He has a normal mood and affect. His behavior is normal. Thought content normal.  Vitals reviewed.    Assessment & Plan:    1. Skin lesion - Ambulatory referral to Dermatology  2. Testicular pain, right - Begin naproxen (NAPROSYN) 500 MG tablet; Take 1 tablet (500 mg total) by mouth 2 (two) times daily with a meal.  Dispense: 30 tablet; Refill: 0 - Begin Ciprofloxacin 500 mg BID x10 days. - Wear supportive  underwear - Pt unable to provide urine for UA.  3. Encounter for screening for HIV - HIV antibody  4. Need for hepatitis C screening test - Hepatitis c antibody (reflex)  5. Need for Tdap vaccination - Tdap vaccine greater than or equal to 7yo IM  6. Need for influenza vaccination - Flu Vaccine QUAD 6+ mos PF IM (Fluarix Quad PF)   Meds ordered this encounter  Medications  . naproxen (NAPROSYN) 500 MG tablet    Sig: Take 1 tablet (500 mg total) by mouth 2 (two) times daily with a meal.    Dispense:  30 tablet    Refill:  0    Order Specific Question:   Supervising Provider    Answer:   Quentin Angst [[id number]]    Follow-up: Return in about 4 weeks (around 03/23/2017) for full physical.   Loletta Specter PA

## 2017-02-23 NOTE — Patient Instructions (Signed)
Orchitis Orchitis is swelling (inflammation) of a testicle caused by infection. Testicles are the male organs that produce sperm. The testicles are held in a fleshy sac (scrotum) located behind the penis. Orchitis usually affects only one testicle, but it can occur in both. The condition can develop suddenly. Orchitis can be caused by many different kinds of bacteria and viruses. What are the causes? Orchitis can be caused by either a bacterial or viral infection. Bacterial Infections  These often occur along with an infection of the coiled tube that collects sperm and sits on top of the testicle (epididymis).  In men who are not sexually active, bacterial orchitis usually starts as a urinary tract infection and spreads to the testicle.  In sexually active men, sexually transmitted infections are the most common cause of bacterial orchitis. These can include: ? Gonorrhea. ? Chlamydia. Viral Infections  Mumps is still the most common cause of viral orchitis, though mumps is now rare in many areas because of vaccination.  Other viruses that can cause orchitis include: ? The chickenpox virus (varicella-zoster virus). ? The virus that causes mononucleosis (Epstein-Barr virus). What increases the risk? Boys and men who have not been vaccinated against mumps are at risk for mumps orchitis. Risk factors for bacterial orchitis include:  Frequent urinary tract infections.  High-risk sexual behaviors.  Having a sexual partner with a sexually transmitted infection.  Having had urinary tract surgery.  Using a tube passed through the penis to drain urine (Foley catheter).  An enlarged prostate gland.  What are the signs or symptoms? The most common symptoms of orchitis are swelling and pain in the scrotum. Other signs and symptoms may include:  Feeling generally sick (malaise).  Fever and chills.  Painful urination.  Painful ejaculation.  Blood or discharge from the  penis.  Nausea.  Headache.  Fatigue.  How is this diagnosed? Your health care provider may suspect orchitis if you have a painful, swollen testicle along with other signs and symptoms of the condition. A physical exam will be done. Tests may also be done to help your health care provider make a diagnosis. These may include:  A blood test to check for signs of infection.  A urine test to check for a urinary tract infection.  Using a swab to collect a fluid sample from the tip of the penis to test for sexually transmitted infections.  Taking an image of the testicle using sound waves and a computer (testicular ultrasound).  How is this treated? Treatment of orchitis depends on the cause. For orchitis caused by a bacterial infection, your health care provider will most likely prescribe antibiotic medicines. Bacterial infections usually clear up within a few days. Both viral infections and bacterial infections may be treated with:  Bed rest.  Anti-inflammatory medicines.  Pain medicines.  Elevating the scrotum and applying ice.  Follow these instructions at home:  Rest as directed by your health care provider.  Take medicines only as directed by your health care provider.  If you were prescribed an antibiotic medicine, finish it all even if you start to feel better.  Elevate your scrotum and apply ice as directed: ? Put ice in a plastic bag. ? Place a small towel or pillow between your legs. ? Rest your scrotum on the pillow or towel. ? Place another towel between your skin and the plastic bag. ? Leave the ice on for 20 minutes, 2-3 times a day. Contact a health care provider if:  You have a  fever.  Pain and swelling have not gotten better after 3 days. Get help right away if:  Your pain is getting worse.  The swelling in your testicle gets worse. This information is not intended to replace advice given to you by your health care provider. Make sure you discuss any  questions you have with your health care provider. Document Released: 04/30/2000 Document Revised: 10/09/2015 Document Reviewed: 09/20/2013 Elsevier Interactive Patient Education  2018 [employer].

## 2017-02-24 LAB — HIV ANTIBODY (ROUTINE TESTING W REFLEX): HIV Screen 4th Generation wRfx: NONREACTIVE

## 2017-02-24 LAB — HEPATITIS C ANTIBODY (REFLEX)

## 2017-02-24 LAB — HCV COMMENT:

## 2017-02-25 ENCOUNTER — Encounter (INDEPENDENT_AMBULATORY_CARE_PROVIDER_SITE_OTHER): Payer: Self-pay

## 2017-02-25 ENCOUNTER — Telehealth (INDEPENDENT_AMBULATORY_CARE_PROVIDER_SITE_OTHER): Payer: Self-pay

## 2017-02-25 NOTE — Telephone Encounter (Signed)
-----   Message from Loletta Specter, PA-C sent at 02/24/2017  8:38 AM EDT ----- HIV and HCV negative.

## 2017-02-25 NOTE — Telephone Encounter (Signed)
Left message asking patient to call the office. Mailed results. Maryjean Morn, CMA

## 2017-03-23 ENCOUNTER — Encounter (INDEPENDENT_AMBULATORY_CARE_PROVIDER_SITE_OTHER): Payer: Self-pay | Admitting: Physician Assistant

## 2017-03-23 ENCOUNTER — Ambulatory Visit (INDEPENDENT_AMBULATORY_CARE_PROVIDER_SITE_OTHER): Payer: Medicaid Other | Admitting: Physician Assistant

## 2017-03-23 VITALS — BP 134/88 | HR 78 | Temp 97.9°F | Ht 67.5 in | Wt 165.0 lb

## 2017-03-23 DIAGNOSIS — G8929 Other chronic pain: Secondary | ICD-10-CM

## 2017-03-23 DIAGNOSIS — Z125 Encounter for screening for malignant neoplasm of prostate: Secondary | ICD-10-CM | POA: Diagnosis not present

## 2017-03-23 DIAGNOSIS — Z0000 Encounter for general adult medical examination without abnormal findings: Secondary | ICD-10-CM

## 2017-03-23 DIAGNOSIS — M25511 Pain in right shoulder: Secondary | ICD-10-CM

## 2017-03-23 DIAGNOSIS — R350 Frequency of micturition: Secondary | ICD-10-CM

## 2017-03-23 DIAGNOSIS — L989 Disorder of the skin and subcutaneous tissue, unspecified: Secondary | ICD-10-CM

## 2017-03-23 LAB — POCT URINALYSIS DIPSTICK
BILIRUBIN UA: NEGATIVE
GLUCOSE UA: NEGATIVE
Ketones, UA: NEGATIVE
Nitrite, UA: NEGATIVE
Protein, UA: NEGATIVE
SPEC GRAV UA: 1.015 (ref 1.010–1.025)
Urobilinogen, UA: 0.2 E.U./dL
pH, UA: 7 (ref 5.0–8.0)

## 2017-03-23 MED ORDER — CLOTRIMAZOLE 1 % EX CREA: 1.0000 "application " | TOPICAL_CREAM | Freq: Two times a day (BID) | CUTANEOUS | 0 refills | Status: DC

## 2017-03-23 MED ORDER — NAPROXEN 500 MG PO TABS: 500.0000 mg | ORAL_TABLET | Freq: Two times a day (BID) | ORAL | 0 refills | Status: DC

## 2017-03-23 NOTE — Patient Instructions (Signed)
Prostate-Specific Antigen Test Why am I having this test? The prostate-specific antigen (PSA) test is performed to determine how much PSA you have in your blood. PSA is a type of protein that is normally present in the prostate gland. Certain conditions can cause PSA blood levels to increase, such as:  Infection in the prostate (prostatitis).  Enlargement of the prostate (hypertrophy).  Prostate cancer.  Because PSA levels increase greatly from prostate cancer, this test can be used to confirm a diagnosis of prostate cancer. It may also be used to monitor treatment for prostate cancer and to watch for a return of prostate cancer after treatment has finished. This test has a very high false-positive rate. Therefore, routine PSA screening for all men is no longer recommended. A false-positive result is incorrect because it indicates a condition or finding is present when it is not. What kind of sample is taken? A blood sample is required for this test. It is usually collected by inserting a needle into a vein or by sticking a finger with a small needle. How do I prepare for this test? There is no preparation required for this test. However, there are factors that can affect the results of a PSA test. To get the most accurate results:  Avoid having a rectal exam within several hours before having your blood drawn for this test.  Avoid having any procedures performed on the prostate gland within 6 weeks of having this test.  Avoid ejaculating within 24 hours of having this test.  Tell your health care provider if you had a recent urinary tract infection (UTI).  Tell your health care provider if you are taking medicines to assist with hair growth, such as finasteride.  Tell your health care provider if you have been exposed to a medicine called diethylstilbestrol.  Let your health care provider know if any of these factors apply to you. You may be asked to reschedule the test. What are the  reference ranges? Reference ranges are established after testing a large group of people. Reference ranges may vary among different people, labs, and hospitals. It is your responsibility to obtain your test results. Ask the lab or department performing the test when and how you will get your results.  Low: 0-2.5 ng/mL.  Slightly to moderately elevated: 2.6-10.0 ng/mL.  Moderately elevated: 10.0-19.9 ng/mL.  Significantly elevated: 20 ng/mL or greater.  What do the results mean? PSA test results greater than 4 ng/mL are found in the majority of men with prostate cancer. If your test result is above this level, this can indicate an increased risk for prostate cancer. Increased PSA levels can also indicate other health conditions. Talk with your health care provider to discuss your results, treatment options, and if necessary, the need for more tests. Talk with your health care provider if you have any questions about your results. Talk with your health care provider to discuss your results, treatment options, and if necessary, the need for more tests. Talk with your health care provider if you have any questions about your results. This information is not intended to replace advice given to you by your health care provider. Make sure you discuss any questions you have with your health care provider. Document Released: 06/05/2004 Document Revised: 01/07/2016 Document Reviewed: 09/26/2013 Elsevier Interactive Patient Education  2018 [employer]  

## 2017-03-23 NOTE — Addendum Note (Signed)
Addended by: Sindy MessingGOMEZ, Laticia Vannostrand D on: 03/23/2017 04:47 PM   Modules accepted: Orders

## 2017-03-23 NOTE — Progress Notes (Addendum)
Subjective:  Patient ID: Gary Gill, male    DOB: 03-May-1961  Age: 56 y.o. MRN: [id number]  CC: annual exam  HPI  Gary Gill is a 56 y.o. male with a medical history of blind left eye s/p eye prosthesis presents for an annual physical exam. 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 Right shoulder pain onset approximately two years ago. Worse with activities at work such as lifting above shoulder. Pain when laying on affected shoulder. Left shoulder pain also but less than right shoulder. Back pain and bilateral knee pain also chronic and bothersome. Complains of skin lesions on scalp that are pruritic at times. Uses neosporin with no relief. Complains of urinary hesitancy. No dysuria, genital lesions. Requests prostate cancer screening. Does not endorse any other symptoms or complaints.     Outpatient Medications Prior to Visit  Medication Sig Dispense Refill  . acetaminophen (TYLENOL) 325 MG tablet Take 650 mg by mouth every 6 (six) hours as needed. For pain    . naproxen (NAPROSYN) 500 MG tablet Take 1 tablet (500 mg total) by mouth 2 (two) times daily with a meal. (Patient not taking: Reported on 03/23/2017) 30 tablet 0  . ciprofloxacin (CIPRO) 500 MG tablet Take 1 tablet (500 mg total) by mouth 2 (two) times daily. 20 tablet 0   No facility-administered medications prior to visit.      ROS Review of Systems  Constitutional: Negative for chills, fever and malaise/fatigue.  Eyes: Negative for blurred vision.  Respiratory: Negative for shortness of breath.   Cardiovascular: Negative for chest pain and palpitations.  Gastrointestinal: Negative for abdominal pain and nausea.  Genitourinary: Negative for dysuria and hematuria.       Urinary hesitancy   Musculoskeletal: Positive for back pain and joint pain. Negative for myalgias.  Skin: Positive for itching and rash.  Neurological: Negative for tingling and  headaches.  Psychiatric/Behavioral: Negative for depression. The patient is not nervous/anxious.     Objective:  BP 134/88 (BP Location: Left Arm, Patient Position: Sitting, Cuff Size: Normal)   Pulse 78   Temp 97.9 F (36.6 C) (Oral)   Ht 5' 7.5" (1.715 m)   Wt 165 lb (74.8 kg)   SpO2 99%   BMI 25.46 kg/m   BP/Weight 03/23/2017 02/23/2017 06/19/2016  Systolic BP 134 115 136  Diastolic BP 88 75 86  Wt. (Lbs) 165 159.6 160  BMI 25.46 25 25.06      Physical Exam  Constitutional: He is oriented to person, place, and time.  Well developed, well nourished, NAD, polite  HENT:  Head: Normocephalic and atraumatic.  Eyes: Conjunctivae and EOM are normal. No scleral icterus.  Right pupil reactive to light  Neck: Normal range of motion. Neck supple. No thyromegaly present.  Cardiovascular: Normal rate, regular rhythm and normal heart sounds.  Pulmonary/Chest: Effort normal and breath sounds normal. No respiratory distress. He has no wheezes. He has no rales.  Abdominal: Soft. Bowel sounds are normal. There is no tenderness.  Genitourinary:  Genitourinary Comments: Prostate approximately 35 g, small nodule felt, nontender, somewhat firm.  Musculoskeletal: He exhibits no edema or deformity.  Pain elicited at the right shoulder at approximately 90 degrees of flexion and abduction. Back with full aROM, however, pain elicited with extension and less so with flexion.  Lymphadenopathy:    He has no cervical adenopathy.  Neurological: He is alert and oriented to person, place, and time. No  cranial nerve deficit. Coordination normal.  Patellar DTR 1+ bilaterally. RUE and LUE strength 5/5 bilaterally. Antalgic gait.  Skin: Skin is warm and dry. No rash noted. No erythema. No pallor.  Few small patch of ovoid lesions with fine scaling on the scalp.  Psychiatric: He has a normal mood and affect. His behavior is normal. Thought content normal.  Vitals reviewed.    Assessment & Plan:        1. Annual physical exam - CBC with Differential - Comprehensive metabolic panel - Lipid panel - Urinalysis Dipstick - PSA  2. Screening for prostate cancer - PSA  3. Skin lesion - Begin clotrimazole 1% cream BID - Already referred to Dermatology at previous visit  4. Chronic right shoulder pain - DG Shoulder Right; Future  5. Urinary frequency - Urinalysis dipstick positive for small leukocytes - Urine culture Meds ordered this encounter  Medications  . clotrimazole (LOTRIMIN) 1 % cream    Sig: Apply 1 application 2 (two) times daily topically.    Dispense:  30 g    Refill:  0    Order Specific Question:   Supervising Provider    Answer:   Quentin AngstJEGEDE, OLUGBEMIGA E [id number]  . naproxen (NAPROSYN) 500 MG tablet    Sig: Take 1 tablet (500 mg total) 2 (two) times daily with a meal by mouth.    Dispense:  30 tablet    Refill:  0    Order Specific Question:   Supervising Provider    Answer:   Quentin AngstJEGEDE, OLUGBEMIGA E [id number]    Follow-up: Return if symptoms worsen or fail to improve, for will call with results.   Loletta Specteroger David Gomez PA

## 2017-03-24 ENCOUNTER — Other Ambulatory Visit (INDEPENDENT_AMBULATORY_CARE_PROVIDER_SITE_OTHER): Payer: Self-pay | Admitting: Physician Assistant

## 2017-03-24 ENCOUNTER — Telehealth (INDEPENDENT_AMBULATORY_CARE_PROVIDER_SITE_OTHER): Payer: Self-pay

## 2017-03-24 DIAGNOSIS — N400 Enlarged prostate without lower urinary tract symptoms: Secondary | ICD-10-CM

## 2017-03-24 LAB — COMPREHENSIVE METABOLIC PANEL
ALBUMIN: 4.8 g/dL (ref 3.5–5.5)
ALT: 26 IU/L (ref 0–44)
AST: 39 IU/L (ref 0–40)
Albumin/Globulin Ratio: 1.7 (ref 1.2–2.2)
Alkaline Phosphatase: 74 IU/L (ref 39–117)
BUN/Creatinine Ratio: 11 (ref 9–20)
BUN: 11 mg/dL (ref 6–24)
Bilirubin Total: 0.3 mg/dL (ref 0.0–1.2)
CALCIUM: 10 mg/dL (ref 8.7–10.2)
CO2: 27 mmol/L (ref 20–29)
CREATININE: 0.96 mg/dL (ref 0.76–1.27)
Chloride: 100 mmol/L (ref 96–106)
GFR calc Af Amer: 102 mL/min/{1.73_m2} (ref >59)
GFR, EST NON AFRICAN AMERICAN: 89 mL/min/{1.73_m2} (ref >59)
GLOBULIN, TOTAL: 2.9 g/dL (ref 1.5–4.5)
GLUCOSE: 97 mg/dL (ref 65–99)
Potassium: 4.8 mmol/L (ref 3.5–5.2)
SODIUM: 140 mmol/L (ref 134–144)
Total Protein: 7.7 g/dL (ref 6.0–8.5)

## 2017-03-24 LAB — LIPID PANEL
CHOL/HDL RATIO: 2.9 ratio (ref 0.0–5.0)
Cholesterol, Total: 283 mg/dL — ABNORMAL HIGH (ref 100–199)
HDL: 99 mg/dL (ref >39)
LDL CALC: 167 mg/dL — AB (ref 0–99)
TRIGLYCERIDES: 87 mg/dL (ref 0–149)
VLDL CHOLESTEROL CAL: 17 mg/dL (ref 5–40)

## 2017-03-24 LAB — CBC WITH DIFFERENTIAL/PLATELET
BASOS ABS: 0 10*3/uL (ref 0.0–0.2)
BASOS: 1 %
EOS (ABSOLUTE): 0.2 10*3/uL (ref 0.0–0.4)
Eos: 4 %
HEMATOCRIT: 45.5 % (ref 37.5–51.0)
HEMOGLOBIN: 15.1 g/dL (ref 13.0–17.7)
IMMATURE GRANS (ABS): 0 10*3/uL (ref 0.0–0.1)
Immature Granulocytes: 0 %
LYMPHS ABS: 1.4 10*3/uL (ref 0.7–3.1)
LYMPHS: 25 %
MCH: 33.3 pg — ABNORMAL HIGH (ref 26.6–33.0)
MCHC: 33.2 g/dL (ref 31.5–35.7)
MCV: 100 fL — ABNORMAL HIGH (ref 79–97)
MONOS ABS: 0.8 10*3/uL (ref 0.1–0.9)
Monocytes: 14 %
NEUTROS ABS: 3.2 10*3/uL (ref 1.4–7.0)
Neutrophils: 56 %
Platelets: 302 10*3/uL (ref 150–379)
RBC: 4.54 x10E6/uL (ref 4.14–5.80)
RDW: 13.2 % (ref 12.3–15.4)
WBC: 5.7 10*3/uL (ref 3.4–10.8)

## 2017-03-24 LAB — PSA: Prostate Specific Ag, Serum: 1.7 ng/mL (ref 0.0–4.0)

## 2017-03-24 MED ORDER — CIPROFLOXACIN HCL 500 MG PO TABS: 500.0000 mg | ORAL_TABLET | Freq: Two times a day (BID) | ORAL | 0 refills | Status: DC

## 2017-03-24 MED ORDER — TAMSULOSIN HCL 0.4 MG PO CAPS: 0.4000 mg | ORAL_CAPSULE | Freq: Every day | ORAL | 1 refills | Status: DC

## 2017-03-24 NOTE — Telephone Encounter (Signed)
-----   Message from Loletta Specteroger David Gomez, PA-C sent at 03/24/2017  9:06 AM EST ----- Cholesterol elevated. PSA is within normal limits. His urinary symptoms are likely due to enlarged prostate. [facility] and follow up at future appointment.

## 2017-03-24 NOTE — Progress Notes (Signed)
Suspected BPH vs prostatitis.

## 2017-03-24 NOTE — Telephone Encounter (Signed)
Patient aware of all lab results and Rx being sent to pharmacy. Maryjean Mornempestt S Sabina Beavers, CMA

## 2017-03-25 LAB — URINE CULTURE

## 2017-03-31 ENCOUNTER — Ambulatory Visit (INDEPENDENT_AMBULATORY_CARE_PROVIDER_SITE_OTHER): Payer: Medicaid Other | Admitting: Family Medicine

## 2017-03-31 ENCOUNTER — Other Ambulatory Visit: Payer: Self-pay

## 2017-03-31 VITALS — BP 136/70 | HR 60 | Temp 98.4°F | Ht 67.5 in | Wt 170.0 lb

## 2017-03-31 DIAGNOSIS — L989 Disorder of the skin and subcutaneous tissue, unspecified: Secondary | ICD-10-CM | POA: Diagnosis not present

## 2017-03-31 NOTE — Addendum Note (Signed)
Addended by: Janit PaganENIOLA, Ernesta Trabert T on: 03/31/2017 02:59 PM   Modules accepted: Level of Service

## 2017-03-31 NOTE — Patient Instructions (Signed)
Skin Biopsy, Care After Refer to this sheet in the next few weeks. These instructions provide you with information about caring for yourself after your procedure. Your health care provider may also give you more specific instructions. Your treatment has been planned according to current medical practices, but problems sometimes occur. Call your health care provider if you have any problems or questions after your procedure. What can I expect after the procedure? After the procedure, it is common to have:  Soreness.  Bruising.  Itching.  Follow these instructions at home:  Rest and then return to your normal activities as told by your health care provider.  Take over-the-counter and prescription medicines only as told by your health care provider.  Follow instructions from your health care provider about how to take care of your biopsy site.Make sure you: ? Wash your hands with soap and water before you change your bandage (dressing). If soap and water are not available, use hand sanitizer. ? Change your dressing as told by your health care provider. ? Leave stitches (sutures), skin glue, or adhesive strips in place. These skin closures may need to stay in place for 2 weeks or longer. If adhesive strip edges start to loosen and curl up, you may trim the loose edges. Do not remove adhesive strips completely unless your health care provider tells you to do that. If the biopsy area bleeds, apply gentle pressure for 10 minutes.  Check your biopsy site every day for signs of infection. Check for: ? More redness, swelling, or pain. ? More fluid or blood. ? Warmth. ? Pus or a bad smell.  Keep all follow-up visits as told by your health care provider. This is important. Contact a health care provider if:  You have more redness, swelling, or pain around your biopsy site.  You have more fluid or blood coming from your biopsy site.  Your biopsy site feels warm to the touch.  You have pus or  a bad smell coming from your biopsy site.  You have a fever. Get help right away if:  You have bleeding that does not stop with pressure or a dressing. This information is not intended to replace advice given to you by your health care provider. Make sure you discuss any questions you have with your health care provider. Document Released: 05/30/2015 Document Revised: 12/28/2015 Document Reviewed: 07/31/2014 Elsevier Interactive Patient Education  2018 Elsevier Inc.  

## 2017-03-31 NOTE — Progress Notes (Signed)
   Subjective:    Gary Gill - 56 y.o. male MRN [id number]  Date of birth: 19-Jan-1961  HPI  Gary DachHarry K Turnley is here for evaluation of scalp lesions. Has multiple lesions on his scalp that have been present for at least 6 months, but likely longer. They felt somewhat raised previously but has been using Clotrimazole prescribed by outside physician for past week and states that they feel flatter now. They have been mildly itchy but otherwise non-painful and do not burn. They have not been draining or bleeding. He noticed them because he shaves his head weekly and saw them in the mirror. He denies fevers, night sweats, nausea, or vomiting.    -  reports that he has been smoking.  he has never used smokeless tobacco. - Review of Systems: Per HPI. - Past Medical History: There are no active problems to display for this patient.  - Medications: reviewed and updated   Objective:   Physical Exam BP 136/70   Pulse 60   Temp 98.4 F (36.9 C) (Oral)   Ht 5' 7.5" (1.715 m)   Wt 170 lb (77.1 kg)   SpO2 99%   BMI 26.23 kg/m  Gen: NAD, alert, cooperative with exam, well-appearing Skin: Several hyperpigmented flat skin lesions without drainage, erythema or increased warmth. One pink lesion that is tender to palpation as see in photo.     Procedure  After informed written consent was obtained, using Betadine for cleansing and 1% Lidocaine with epinephrine for anesthetic, with sterile technique a 3 mm punch biopsy was used to obtain two biopsy specimen of the lesions at the base of the scalp and above it on the occiput. Hemostasis was obtained by pressure and silver nitrite. Wound care instructions provided. Be alert for any signs of cutaneous infection. The specimen is labeled and sent to pathology for evaluation. The procedure was well tolerated without complications.    Assessment & Plan:   Scalp lesion Several hyperpigmented skin lesions present and one newer appearing pink lesion present.  Biopsy obtained of pink lesion and of a hyperpigmented lesion. Specimens sent to pathology for further evaluation. Instructed patient on wound care and signs of infection. Will call patient with results.   Marcy Sirenatherine Jahmier Willadsen, D.O. 03/31/2017, 2:42 PM PGY-3, [facility]

## 2017-04-05 ENCOUNTER — Telehealth: Payer: Self-pay | Admitting: Family Medicine

## 2017-04-05 NOTE — Telephone Encounter (Addendum)
HIPPA compliant message left for him to call back.  Note: Pathology result of his scalp lesion : Seborrheic Keratosis. No malignancy.  Although image looks atypical. Might need to return for cryotherapy.  Will forward result to his PCP to schedule follow-up.  Note: No PCP on file. I routed message to the provider who referred patient to us.

## 2017-04-05 NOTE — Telephone Encounter (Signed)
Please call patient and ask him to make an appointment with the dermatologist that saw him to cauterize the lesions on his head.

## 2017-04-06 NOTE — Telephone Encounter (Signed)
Patient aware to call dermatologist to schedule to have lesion cauterized. Gary Gill S Rilynn Habel, CMA

## 2017-04-19 ENCOUNTER — Other Ambulatory Visit (INDEPENDENT_AMBULATORY_CARE_PROVIDER_SITE_OTHER): Payer: Self-pay | Admitting: Physician Assistant

## 2017-04-19 NOTE — Telephone Encounter (Signed)
FWD to PCP. Tempestt S Roberts, CMA  

## 2017-06-02 ENCOUNTER — Ambulatory Visit (INDEPENDENT_AMBULATORY_CARE_PROVIDER_SITE_OTHER): Payer: Medicaid Other | Admitting: Family Medicine

## 2017-06-02 ENCOUNTER — Ambulatory Visit (INDEPENDENT_AMBULATORY_CARE_PROVIDER_SITE_OTHER): Payer: Medicaid Other | Admitting: Physician Assistant

## 2017-06-02 ENCOUNTER — Other Ambulatory Visit: Payer: Self-pay

## 2017-06-02 ENCOUNTER — Encounter: Payer: Self-pay | Admitting: Family Medicine

## 2017-06-02 DIAGNOSIS — L821 Other seborrheic keratosis: Secondary | ICD-10-CM | POA: Diagnosis not present

## 2017-06-02 NOTE — Progress Notes (Signed)
Patient here to follow-up with his scalp lesion s/p biopsy. I discussed his pathology result with him. I gave the option of conservative measure vs cryotherapy granted that the therapy can leave a scare on his scalp as well. He opted to go ahead with cryotherapy. Both verbal and written informed consent were obtained. We will freeze just one to two spot today and see how he reacts to it. Procedure performed by the resident (Dr. Sydnee Cabaliallo & Dr. Janee Mornhompson). I worked them through the process.  F/U in 4-8 weeks for reassessment. See full procedure note by the resident.

## 2017-06-02 NOTE — Assessment & Plan Note (Signed)
Applied cryotherapy to small demarcated area to assess cosmetic affects after healing given that it is on scalp. Used liquid nitrogen for 3s application for 2 cycles. Therapy completed without complication. Will have patient return in 3 weeks to assess healing and possible further therapy.

## 2017-06-02 NOTE — Progress Notes (Signed)
    Subjective:  Gary Gill is a 57 y.o. male who [facility] for cryotherapy removal of seborrheic keratitis.   HPI:  seborrheic keratosis Patient has multiple lesions on the back of his scalp in the left occiput region. They have been there for at least 8 months. They are pruritic. They are not getting worse. Patient has been applied topical hydrocortisone without improvement. Previous skin biopsy of lesions showed skin changes consistent with seborrheic keratitis. He denies any bleeding, infection, pus drainage.   ROS: Per HPI   Objective:  Physical Exam: BP 122/78   Pulse 66   Temp 97.9 F (36.6 C) (Oral)   Ht 5\' 8"  (1.727 m)   Wt 168 lb (76.2 kg)   SpO2 99%   BMI 25.54 kg/m   Gen: NAD, resting comfortably Skin: Multiple dark, crusted plaques across occiput near nape as seen below (marked region is s/p cryotherapy).     No results found for this or any previous visit (from the past 72 hour(s)).   Assessment/Plan:  Seborrheic keratoses Applied cryotherapy to small demarcated area to assess cosmetic affects after healing given that it is on scalp. Used liquid nitrogen for 3s application for 2 cycles. Therapy completed without complication. Will have patient return in 3 weeks to assess healing and possible further therapy.    Lab Orders  No laboratory test(s) ordered today    No orders of the defined types were placed in this encounter.   Thomes DinningBrad Ismelda Weatherman, MD, MS FAMILY MEDICINE RESIDENT - PGY1 06/02/2017 2:17 PM

## 2017-06-02 NOTE — Patient Instructions (Signed)
Cryotherapy  WHAT IS CRYOTHERAPY?  Cryotherapy, or cold therapy, is a treatment that uses cold temperatures to treat an injury or medical condition. It includes using cold packs or ice packs to reduce pain and swelling. Only use cryotherapy if your doctor says it is okay.  HOW DO I USE CRYOTHERAPY?   Place a towel between the cold source and your skin.   Apply the cold source for no more than 20 minutes at a time.   Check your skin after 5 minutes to make sure there are no signs of a poor response to cold or skin damage. Check for:  ? White spots on your skin. Your skin may look blotchy or mottled.  ? Skin that looks blue or pale.  ? Skin that feels waxy or hard.   Repeat these steps as many times each day as told by your doctor.    HOW CAN I MAKE A COLD PACK?  When using a cold pack at home to reduce pain and swelling, you can use:   A silica gel cold pack that has been left in the freezer. You can buy this online or in stores.   A plastic bag of frozen vegetables.   A sealable plastic bag that has been filled with crushed ice.    Always wrap the pack in a dry or damp towel to avoid direct contact with your skin.  WHEN SHOULD I CALL MY DOCTOR?  Call your doctor if:   You start to have white spots on your skin. This may give your skin a blotchy or mottled look.   Your skin turns blue or pale.   Your skin becomes waxy or hard.   Your swelling gets worse.    This information is not intended to replace advice given to you by your health care provider. Make sure you discuss any questions you have with your health care provider.  Document Released: 10/20/2007 Document Revised: 10/09/2015 Document Reviewed: 01/15/2015  Elsevier Interactive Patient Education  2017 Elsevier Inc.

## 2017-06-10 DIAGNOSIS — L821 Other seborrheic keratosis: Secondary | ICD-10-CM

## 2017-06-23 ENCOUNTER — Other Ambulatory Visit: Payer: Self-pay

## 2017-06-23 ENCOUNTER — Encounter: Payer: Self-pay | Admitting: Family Medicine

## 2017-06-23 ENCOUNTER — Ambulatory Visit (INDEPENDENT_AMBULATORY_CARE_PROVIDER_SITE_OTHER): Payer: Medicaid Other | Admitting: Family Medicine

## 2017-06-23 VITALS — BP 108/72 | HR 81 | Temp 98.4°F | Wt 165.6 lb

## 2017-06-23 DIAGNOSIS — L821 Other seborrheic keratosis: Secondary | ICD-10-CM

## 2017-06-23 NOTE — Progress Notes (Signed)
    Subjective:  Gary Gill is a 57 y.o. male who [facility] with a chief complaint of wanting more cryotherapy for sebhoric dermatitis.   HPI: Patient has a history of significant sebhoric dermatitis that he was treated with cryotherapy for on a single lesion as a test.   He thinks it has improved that spot significantly and would like cryotherapy on every lesion on his head.   There was some minor scarring and pink discoloration but no bleeding per the patient.  He is insistent he wants all the lesions treated before his birthday  Objective:  Physical Exam: BP 108/72   Pulse 81   Temp 98.4 F (36.9 C) (Oral)   Wt 165 lb 9.6 oz (75.1 kg)   SpO2 99%   BMI 25.18 kg/m   Gen: NAD, conversing comfortably CV: Regular rate Pulm: NWOB, MSK: no edema, cyanosis, or clubbing noted Skin: warm, dry with multiple large sebhoric scales on scalp.  The prior treated had resolution of scaling on half the treated portion with pink scarring but no bleeding.  The other half the treated portion appears to have reduced but persistent scaling. Neuro: grossly normal, moves all extremities Psych: Normal affect and thought content  No results found for this or any previous visit (from the past 72 hour(s)).   Assessment/Plan:  Seborrheic keratoses Prior cryo treatment was succesful on half the lesion treated with minor scarring that is expected to resolve.   Patient is insistent about wanting all lesions treated today but we are going to retreat the resistent lesion, add a treatment to a new lesion and reobserve in 4 wks prior to committing to extensive treatment.  Patient schedule for next visit 3/24   Marthenia RollingScott Hien Perreira, DO FAMILY MEDICINE RESIDENT - PGY1 06/23/2017 4:11 PM

## 2017-06-23 NOTE — Assessment & Plan Note (Signed)
Prior cryo treatment was succesful on half the lesion treated with minor scarring that is expected to resolve.   Patient is insistent about wanting all lesions treated today but we are going to retreat the resistent lesion, add a treatment to a new lesion and reobserve in 4 wks prior to committing to extensive treatment.  Patient schedule for next visit 3/24

## 2017-06-23 NOTE — Patient Instructions (Signed)
Seborrheic Dermatitis, Adult Seborrheic dermatitis is a skin disease that causes red, scaly patches. It usually occurs on the scalp, and it is often called dandruff. The patches may appear on other parts of the body. Skin patches tend to appear where there are many oil glands in the skin. Areas of the body that are commonly affected include:  Scalp.  Skin folds of the body.  Ears.  Eyebrows.  Neck.  Face.  Armpits.  The bearded area of men's faces.  The condition may come and go for no known reason, and it is often long-lasting (chronic). What are the causes? The cause of this condition is not known. What increases the risk? This condition is more likely to develop in people who:  Have certain conditions, such as: ? HIV (human immunodeficiency virus). ? AIDS (acquired immunodeficiency syndrome). ? Parkinson disease. ? Mood disorders, such as depression.  Are 40-60 years old.  What are the signs or symptoms? Symptoms of this condition include:  Thick scales on the scalp.  Redness on the face or in the armpits.  Skin that is flaky. The flakes may be white or yellow.  Skin that seems oily or dry but is not helped with moisturizers.  Itching or burning in the affected areas.  How is this diagnosed? This condition is diagnosed with a medical history and physical exam. A sample of your skin may be tested (skin biopsy). You may need to see a skin specialist (dermatologist). How is this treated? There is no cure for this condition, but treatment can help to manage the symptoms. You may get treatment to remove scales, lower the risk of skin infection, and reduce swelling or itching. Treatment may include:  Creams that reduce swelling and irritation (steroids).  Creams that reduce skin yeast.  Medicated shampoo, soaps, moisturizing creams, or ointments.  Medicated moisturizing creams or ointments.  Follow these instructions at home:  Apply over-the-counter and  prescription medicines only as told by your health care provider.  Use any medicated shampoo, soaps, skin creams, or ointments only as told by your health care provider.  Keep all follow-up visits as told by your health care provider. This is important. Contact a health care provider if:  Your symptoms do not improve with treatment.  Your symptoms get worse.  You have new symptoms. This information is not intended to replace advice given to you by your health care provider. Make sure you discuss any questions you have with your health care provider. Document Released: 05/03/2005 Document Revised: 11/21/2015 Document Reviewed: 08/21/2015 Elsevier Interactive Patient Education  2018 Elsevier Inc.  

## 2017-08-04 ENCOUNTER — Other Ambulatory Visit: Payer: Self-pay

## 2017-08-04 ENCOUNTER — Ambulatory Visit (INDEPENDENT_AMBULATORY_CARE_PROVIDER_SITE_OTHER): Payer: Medicaid Other | Admitting: Family Medicine

## 2017-08-04 VITALS — BP 122/80 | HR 96 | Temp 98.0°F | Wt 175.0 lb

## 2017-08-04 DIAGNOSIS — L821 Other seborrheic keratosis: Secondary | ICD-10-CM

## 2017-08-04 NOTE — Progress Notes (Addendum)
Cryotherapy Procedure Note  Pre-operative Diagnosis: seborrheic keratoses  Post-operative Diagnosis: seborrheic keratoses  Locations: head  Indications: reduce irritation, destroy pre-cancerous lesions  Anesthesia: not required   Procedure Details  History of allergy to iodine: no. Pacemaker? no.  Patient informed of risks (permanent scarring, infection, light or dark discoloration, bleeding, infection, weakness, numbness and recurrence of the lesion) and benefits of the procedure and written informed consent obtained.  The areas are treated with liquid nitrogen therapy, frozen until ice ball extended 5 mm beyond lesion, allowed to thaw, and treated again. The patient tolerated procedure well.  The patient was instructed on post-op care, warned that there may be blister formation, redness and pain. Recommend OTC analgesia as needed for pain.  Condition: Stable  Complications: none.  Plan: 1. Instructed to keep the area dry and covered for 24-48h and clean thereafter. 2. Warning signs of infection were reviewed.   3. Recommended that the patient use tylenol as needed for pain.  4. Return in 2 months.  Myrene BuddyJacob Gregor Dershem MD PGY-1 Family Medicine Resident  I was present for the history physical and treatment decision making and agree with th above  Carney LivingMarshall L Chambliss

## 2017-08-04 NOTE — Patient Instructions (Signed)
It was great seeing you today! Today we sprayed those four areas on your head with a cold liquid to freeze them off. These spots should go away on their own. If they have not gone away in 2 months, please call and scheduled another appointment at the dermatology clinic. You can apply hydrocortisone creme up to three times per day to these areas for pain relief. You can put a cotton gauze on these at night when they blister up to prevent leakage onto your pillows.

## 2018-07-02 ENCOUNTER — Other Ambulatory Visit: Payer: Self-pay

## 2018-07-02 ENCOUNTER — Ambulatory Visit (HOSPITAL_COMMUNITY)
Admission: EM | Admit: 2018-07-02 | Discharge: 2018-07-02 | Disposition: A | Discharge status: Home / Self Care | Payer: Medicaid Other | Attending: Internal Medicine | Admitting: Internal Medicine

## 2018-07-02 ENCOUNTER — Encounter (HOSPITAL_COMMUNITY): Payer: Self-pay | Admitting: *Deleted

## 2018-07-02 DIAGNOSIS — B9789 Other viral agents as the cause of diseases classified elsewhere: Secondary | ICD-10-CM | POA: Diagnosis not present

## 2018-07-02 DIAGNOSIS — J069 Acute upper respiratory infection, unspecified: Secondary | ICD-10-CM

## 2018-07-02 MED ORDER — HYDROCOD POLST-CPM POLST ER 10-8 MG/5ML PO SUER: 5.0000 mL | Freq: Two times a day (BID) | ORAL | 0 refills | Status: DC | PRN

## 2018-07-02 MED ORDER — OSELTAMIVIR PHOSPHATE 75 MG PO CAPS: 75.0000 mg | ORAL_CAPSULE | Freq: Two times a day (BID) | ORAL | 0 refills | Status: DC

## 2018-07-02 NOTE — ED Triage Notes (Signed)
C/O cough, congestion, body aches x 2-3 days.  Unsure if fevers.

## 2018-11-03 ENCOUNTER — Ambulatory Visit: Payer: PRIVATE HEALTH INSURANCE

## 2018-11-09 ENCOUNTER — Ambulatory Visit: Payer: PRIVATE HEALTH INSURANCE

## 2018-12-14 ENCOUNTER — Other Ambulatory Visit: Payer: Self-pay

## 2018-12-14 ENCOUNTER — Encounter (HOSPITAL_COMMUNITY): Payer: Self-pay | Admitting: Family Medicine

## 2018-12-14 ENCOUNTER — Ambulatory Visit (HOSPITAL_COMMUNITY)
Admission: EM | Admit: 2018-12-14 | Discharge: 2018-12-14 | Disposition: A | Discharge status: Home / Self Care | Payer: Medicaid Other | Attending: Family Medicine | Admitting: Family Medicine

## 2018-12-14 DIAGNOSIS — S91332A Puncture wound without foreign body, left foot, initial encounter: Secondary | ICD-10-CM | POA: Diagnosis not present

## 2018-12-14 DIAGNOSIS — W450XXA Nail entering through skin, initial encounter: Secondary | ICD-10-CM | POA: Diagnosis not present

## 2018-12-14 MED ORDER — LIDOCAINE HCL (PF) 1 % IJ SOLN
INTRAMUSCULAR | Status: AC
  Filled 2018-12-14: qty 2

## 2018-12-14 MED ORDER — CEPHALEXIN 500 MG PO CAPS: 500.0000 mg | ORAL_CAPSULE | Freq: Three times a day (TID) | ORAL | 0 refills | Status: AC

## 2018-12-14 MED ORDER — CEFTRIAXONE SODIUM 1 G IJ SOLR
1.0000 g | Freq: Once | INTRAMUSCULAR | Status: AC
  Administered 2018-12-14: 16:00:00 1 g via INTRAMUSCULAR

## 2018-12-14 MED ORDER — CEFTRIAXONE SODIUM 1 G IJ SOLR
INTRAMUSCULAR | Status: AC
  Filled 2018-12-14: qty 10

## 2018-12-14 NOTE — Discharge Instructions (Signed)
Wash the foot with warm soap and water twice daily

## 2018-12-14 NOTE — ED Provider Notes (Signed)
[place]    CSN: [id number] Arrival date & time: 12/14/18  1530     History   Chief Complaint Chief Complaint  Patient presents with  . Foot Injury    left    HPI Gary Gill is a 58 y.o. male.   58 yo established [place] patient with left foot injury.  Pt reports stepping on a rusty nail two days ago with his left foot.  History of Tetanus is not known, maybe within the last ten years.   02/23/2017 listed as last Td.  Patient states that he has been furloughed and has no money     Past Medical History:  Diagnosis Date  . Blind left eye   . History of eye prosthesis     Patient Active Problem List   Diagnosis Date Noted  . Seborrheic keratoses 06/02/2017    Past Surgical History:  Procedure Laterality Date  . ruptured eye     eye consultants have done surgery on the left eye.        Home Medications    Prior to Admission medications   Medication Sig Start Date End Date Taking? Authorizing Provider  acetaminophen (TYLENOL) 325 MG tablet Take 650 mg by mouth every 6 (six) hours as needed. For pain    [provider]  cephALEXin (KEFLEX) 500 MG capsule Take 1 capsule (500 mg total) by mouth 3 (three) times daily. 12/14/18   Robyn Haber, MD  chlorpheniramine-HYDROcodone (TUSSIONEX PENNKINETIC ER) 10-8 MG/5ML SUER Take 5 mLs by mouth every 12 (twelve) hours as needed for cough. 07/02/18   Harrie Foreman, MD  ciprofloxacin (CIPRO) 500 MG tablet Take 1 tablet (500 mg total) 2 (two) times daily by mouth. 03/24/17   Clent Demark, PA-C  clotrimazole (LOTRIMIN) 1 % cream Apply 1 application 2 (two) times daily topically. 03/23/17   Clent Demark, PA-C  naproxen (NAPROSYN) 500 MG tablet Take 1 tablet (500 mg total) 2 (two) times daily with a meal by mouth. 03/23/17   Clent Demark, PA-C  oseltamivir (TAMIFLU) 75 MG capsule Take 1 capsule (75 mg total) by mouth every 12 (twelve) hours. 07/02/18   Harrie Foreman, MD   tamsulosin (FLOMAX) 0.4 MG CAPS capsule Take 1 capsule (0.4 mg total) daily by mouth. 04/19/17   Clent Demark, PA-C    Family History Family History  Problem Relation Age of Onset  . Healthy Father     Social History Social History   Tobacco Use  . Smoking status: Current Every Day Smoker  . Smokeless tobacco: Never Used  Substance Use Topics  . Alcohol use: Yes    Comment: daily  . Drug use: Yes    Types: Marijuana     Allergies   Patient has no known allergies.   Review of Systems Review of Systems  Constitutional: Negative.   Musculoskeletal: Positive for gait problem and joint swelling.     Physical Exam Triage Vital Signs ED Triage Vitals  Enc Vitals Group     BP      Pulse      Resp      Temp      Temp src      SpO2      Weight      Height      Head Circumference      Peak Flow      Pain Score      Pain Loc      Pain  Edu?      Excl. in GC?    No data found.  Updated Vital Signs BP 115/78 (BP Location: Left Arm)   Pulse 82   Temp 98.2 F (36.8 C) (Oral)   Resp 18   SpO2 97%    Physical Exam Vitals signs reviewed.  Constitutional:      Appearance: Normal appearance. He is normal weight.  Eyes:     Conjunctiva/sclera: Conjunctivae normal.  Neck:     Musculoskeletal: Normal range of motion and neck supple.  Pulmonary:     Effort: Pulmonary effort is normal.  Musculoskeletal: Normal range of motion.  Skin:    General: Skin is warm and dry.     Findings: Erythema present.     Comments: Diffuse erythema left plantar area surrounding 1/2 cm black area on ball of great toe where the nail penetrated.  Neurological:     General: No focal deficit present.     Mental Status: He is alert.  Psychiatric:        Mood and Affect: Mood normal.      UC Treatments / Results  Labs (all labs ordered are listed, but only abnormal results are displayed) Labs Reviewed - No data to display  EKG   Radiology No results found.   Procedures Procedures (including critical care time)  Medications Ordered in UC Medications  cefTRIAXone (ROCEPHIN) injection 1 g (has no administration in time range)    Initial Impression / Assessment and Plan / UC Course  I have reviewed the triage vital signs and the nursing notes.  Pertinent labs & imaging results that were available during my care of the patient were reviewed by me and considered in my medical decision making (see chart for details).    Final Clinical Impressions(s) / UC Diagnoses   Final diagnoses:  Puncture wound of left foot, initial encounter     Discharge Instructions     Wash the foot with warm soap and water twice daily    ED Prescriptions    Medication Sig Dispense Auth. Provider   cephALEXin (KEFLEX) 500 MG capsule Take 1 capsule (500 mg total) by mouth 3 (three) times daily. 20 capsule Elvina SidleLauenstein, Elai Vanwyk, MD     Controlled Substance Prescriptions [place] Controlled Substance Registry consulted? Not Applicable   Elvina SidleLauenstein, Abbiegail Landgren, MD 12/14/18 [phone]

## 2018-12-14 NOTE — ED Triage Notes (Signed)
Pt reports stepping on a rusty nail two days ago with his left foot.  History of Tetanus is not known, maybe within the last ten years.

## 2018-12-21 ENCOUNTER — Ambulatory Visit (INDEPENDENT_AMBULATORY_CARE_PROVIDER_SITE_OTHER): Payer: PRIVATE HEALTH INSURANCE | Admitting: Family Medicine

## 2018-12-21 ENCOUNTER — Other Ambulatory Visit: Payer: Self-pay

## 2018-12-21 VITALS — BP 120/80 | HR 71 | Temp 97.5°F | Wt 154.0 lb

## 2018-12-21 DIAGNOSIS — L821 Other seborrheic keratosis: Secondary | ICD-10-CM

## 2018-12-21 MED ORDER — HYDROXYZINE HCL 10 MG PO TABS: 10.0000 mg | ORAL_TABLET | Freq: Three times a day (TID) | ORAL | 0 refills | Status: AC | PRN

## 2018-12-21 NOTE — Patient Instructions (Signed)
Nice seeing you today. We froze the lesion on the back of your head. This will form a scab and eventually fall off. Please return in about 2 weeks for reassessment or sooner if having worsening of your symptoms.  Cryoablation Cryoablation is a procedure used to remove abnormal growths or cancerous tissue. This is done by freezing the growth or tissue with either liquid nitrogen or argon gas. This procedure may be done to treat many conditions, including:  Skin tumors.  Non-cancerous (benign) nodules.  A type of eye cancer (retinoblastoma).  Cancers of the prostate, liver, kidney, cervix, lung, and bone. Tell a health care provider about:  Any allergies you have.  All medicines you are taking, including vitamins, herbs, eye drops, creams, and over-the-counter medicines.  Any problems you or family members have had with anesthetic medicines.  Any blood disorders you have.  Any surgeries you have had.  Any medical conditions you have.  Whether you are pregnant or may be pregnant. What are the risks? Generally, this is a safe procedure. However, problems may occur, including:  Infection.  Bleeding.  Swelling.  Nerve damage and loss of feeling. This is rare.  Allergic reactions to medicines.  Damage to other structures or organs. What happens before the procedure? Staying hydrated Follow instructions from your health care provider about hydration, which may include:  Up to 2 hours before the procedure - you may continue to drink clear liquids, such as water, clear fruit juice, black coffee, and plain tea. Eating and drinking restrictions Follow instructions from your health care provider about eating and drinking, which may include:  8 hours before the procedure - stop eating heavy meals or foods such as meat, fried foods, or fatty foods.  6 hours before the procedure - stop eating light meals or foods, such as toast or cereal.  6 hours before the procedure - stop  drinking milk or drinks that contain milk.  2 hours before the procedure - stop drinking clear liquids. Medicines  Ask your health care provider about: ? Changing or stopping your regular medicines. This is especially important if you are taking diabetes medicines or blood thinners. ? Taking medicines such as aspirin and ibuprofen. These medicines can thin your blood. Do not take these medicines before your procedure if your health care provider instructs you not to.  You may be given antibiotic medicine to help prevent infection. General instructions  You may have blood tests.  Plan to have someone take you home from the hospital or clinic.  If you will be going home right after the procedure, plan to have someone with you for 24 hours.  Ask your health care provider how your surgical site will be marked or identified. What happens during the procedure?  To reduce your risk of infection: ? Your health care team will wash or sanitize their hands. ? Your skin will be washed with soap. ? Hair may be removed from the surgical area.  An IV tube will be inserted into one of your veins.  You will be given one or more of the following: ? A medicine to help you relax (sedative). ? A medicine to numb the area (local anesthetic). ? A medicine to make you fall asleep (general anesthetic). ? A medicine that is injected into your spine to numb the area below and slightly above the injection site (spinal anesthetic). ? A medicine that is injected into an area of your body to numb everything below the injection site (regional  anesthetic).  Depending on the location of the growth, a small incision may be made. A bronchoscope may be used for a lung tumor, or a laparoscope may be used for an abdominal tumor.  Your health care provider may use a device (cryoprobe) that has liquid nitrogen or argon gas flowing through it. The cryoprobe will be inserted through the incision to the area where the  tumor is located. This may be done with guidance from imaging such as an ultrasound, CT scan, or MRI scan.  Liquid nitrogen or argon gas will be delivered through the cryoprobe to the growth until it is frozen and destroyed.  The process may be repeated on other areas depending on how many areas need treatment.  The cryoprobe will be removed and pressure will be applied to stop any bleeding.  The incision will be closed with stitches (sutures).  The incision will be covered with a bandage (dressing). The procedure will vary depending on the location of the tumor or nodule. The procedure may also vary among health care providers and hospitals. What happens after the procedure?  Do not drive for 24 hours if you received a sedative.  Your blood pressure, heart rate, breathing rate, and blood oxygen level will be monitored until the medicines you were given have worn off.  You will be given medicine to help with pain, nausea, and vomiting as needed. This information is not intended to replace advice given to you by your health care provider. Make sure you discuss any questions you have with your health care provider. Document Released: 02/21/2013 Document Revised: 04/15/2017 Document Reviewed: 10/01/2015 Elsevier Patient Education  2020 [employer].

## 2018-12-21 NOTE — Progress Notes (Signed)
   Subjective:    Patient ID: Gary Gill, male    DOB: [birth year], 58 y.o.   MRN: [id number]   CC: Seborric keratosis of the scalp  HPI:  Patient presents with recurrence of seborrheic keratosis lesions on his scalp.  Patient states he has multiple lesions on his posterior scalp that have been present for about 8 to 10 months.  These lesions are different from the lesions he had frozen off prior.  He described them as appearing at varying times and sizes.  They cause great distress due to being pruritic.  He scratches them frequently and as a consequence has had bleeding from excoriations with scabs.  When the scabs fall off he still has the same lesion underneath.   Lesions on chest-patient has multiple papule/pustule lesions with open surface on chest diffusely.  Skin looks irritated but not infectious.  Lesions appear to be at hair follicles.  Patient denies cosmetic shaving.  Denies any new soaps or lotions.  Patient endorses moderate to severe itchiness.  They have been present for several months.   Smoking status reviewed   ROS: pertinent noted in the HPI   Past medical history, surgical, family, and social history reviewed and updated in the EMR as appropriate.  Objective:    BP 120/80   Pulse 71   Temp (!) 97.5 F (36.4 C) (Oral)   Wt 154 lb (69.9 kg)   SpO2 99%   BMI 23.42 kg/m   Vitals and nursing note reviewed  Derm: Please see clinical images for further detail Occipital region of scalp-approximately 4 cm in diameter and 1 cm height for lesion.  Spares hair follicles.  Raised hyperpigmented lesion.  Firm and mobile to touch. Chest-multiple hair follicles have papule/pustule lesions.  Intermittently the lesions have ulcerated skin only superficial surface.  Does not appear to have any drainage surrounding erythema or induration.  Appears to be excoriations.   Assessment & Plan:   Examined patient and observed procedure with attending. Plan to be provided by  attending   Doristine Mango, [facility] PGY-2

## 2018-12-21 NOTE — Addendum Note (Signed)
Addended by: Andrena Mews T on: 12/21/2018 04:40 PM   Modules accepted: Orders

## 2019-01-26 ENCOUNTER — Ambulatory Visit: Payer: PRIVATE HEALTH INSURANCE

## 2019-03-16 DIAGNOSIS — M542 Cervicalgia: Secondary | ICD-10-CM | POA: Diagnosis not present

## 2019-03-16 DIAGNOSIS — M791 Myalgia, unspecified site: Secondary | ICD-10-CM | POA: Insufficient documentation

## 2019-03-16 DIAGNOSIS — Z79899 Other long term (current) drug therapy: Secondary | ICD-10-CM | POA: Insufficient documentation

## 2019-03-16 DIAGNOSIS — F17290 Nicotine dependence, other tobacco product, uncomplicated: Secondary | ICD-10-CM | POA: Diagnosis not present

## 2019-03-16 DIAGNOSIS — R202 Paresthesia of skin: Secondary | ICD-10-CM | POA: Insufficient documentation

## 2019-03-17 ENCOUNTER — Emergency Department (HOSPITAL_COMMUNITY): Payer: Medicaid Other

## 2019-03-17 ENCOUNTER — Emergency Department (HOSPITAL_COMMUNITY)
Admission: EM | Admit: 2019-03-17 | Discharge: 2019-03-17 | Disposition: A | Discharge status: Home / Self Care | Payer: Medicaid Other | Attending: Emergency Medicine | Admitting: Emergency Medicine

## 2019-03-17 ENCOUNTER — Encounter (HOSPITAL_COMMUNITY): Payer: Self-pay | Admitting: Emergency Medicine

## 2019-03-17 ENCOUNTER — Other Ambulatory Visit: Payer: Self-pay

## 2019-03-17 DIAGNOSIS — M542 Cervicalgia: Secondary | ICD-10-CM

## 2019-03-17 DIAGNOSIS — R202 Paresthesia of skin: Secondary | ICD-10-CM

## 2019-03-17 DIAGNOSIS — M7918 Myalgia, other site: Secondary | ICD-10-CM

## 2019-03-17 DIAGNOSIS — W11XXXA Fall on and from ladder, initial encounter: Secondary | ICD-10-CM

## 2019-03-17 MED ORDER — ONDANSETRON HCL 4 MG/2ML IJ SOLN
4.0000 mg | Freq: Once | INTRAMUSCULAR | Status: AC
  Administered 2019-03-17: 11:00:00 4 mg via INTRAVENOUS
  Filled 2019-03-17: qty 2

## 2019-03-17 MED ORDER — OXYCODONE-ACETAMINOPHEN 5-325 MG PO TABS
1.0000 | ORAL_TABLET | Freq: Once | ORAL | Status: AC
  Administered 2019-03-17: 1 via ORAL
  Filled 2019-03-17: qty 1

## 2019-03-17 MED ORDER — OXYCODONE-ACETAMINOPHEN 5-325 MG PO TABS: 1.0000 | ORAL_TABLET | Freq: Four times a day (QID) | ORAL | 0 refills | Status: AC | PRN

## 2019-03-17 MED ORDER — MORPHINE SULFATE (PF) 4 MG/ML IV SOLN
4.0000 mg | Freq: Once | INTRAVENOUS | Status: AC
  Administered 2019-03-17: 11:00:00 4 mg via INTRAVENOUS
  Filled 2019-03-17: qty 1

## 2019-03-17 MED ORDER — LORAZEPAM 2 MG/ML IJ SOLN
1.0000 mg | Freq: Once | INTRAMUSCULAR | Status: AC
  Administered 2019-03-17: 12:00:00 1 mg via INTRAVENOUS
  Filled 2019-03-17: qty 1

## 2019-03-17 MED ORDER — OXYCODONE-ACETAMINOPHEN 5-325 MG PO TABS
1.0000 | ORAL_TABLET | Freq: Once | ORAL | Status: AC
  Administered 2019-03-17: 08:00:00 1 via ORAL
  Filled 2019-03-17: qty 1

## 2019-03-17 MED ORDER — METHYLPREDNISOLONE 4 MG PO TBPK: ORAL_TABLET | ORAL | 0 refills | Status: AC

## 2019-03-17 MED ORDER — PREDNISONE 20 MG PO TABS
60.0000 mg | ORAL_TABLET | Freq: Once | ORAL | Status: AC
  Administered 2019-03-17: 14:00:00 60 mg via ORAL
  Filled 2019-03-17: qty 3

## 2019-03-17 MED ORDER — METHOCARBAMOL 500 MG PO TABS
500.0000 mg | ORAL_TABLET | Freq: Once | ORAL | Status: AC
  Administered 2019-03-17: 14:00:00 500 mg via ORAL
  Filled 2019-03-17: qty 1

## 2019-03-17 MED ORDER — GADOBUTROL 1 MMOL/ML IV SOLN
7.0000 mL | Freq: Once | INTRAVENOUS | Status: AC | PRN
  Administered 2019-03-17: 13:00:00 7 mL via INTRAVENOUS

## 2019-03-17 MED ORDER — METHOCARBAMOL 500 MG PO TABS: 500.0000 mg | ORAL_TABLET | Freq: Two times a day (BID) | ORAL | 0 refills | Status: AC

## 2019-03-17 NOTE — Discharge Instructions (Signed)
Please take medications as directed if pain is not improving over the next week please follow-up with neurosurgery.  Your imaging today was reassuring and I suspect the pain you are having in your hands is due to nerve irritation from your fall.  Return for any new or worsening symptoms.

## 2019-03-17 NOTE — ED Notes (Signed)
Pt taken to imaging

## 2019-03-17 NOTE — ED Provider Notes (Signed)
[facility] EMERGENCY DEPARTMENT Provider Note   CSN: [id number] Arrival date & time: 03/16/19  2358     History   Chief Complaint Chief Complaint  Patient presents with   Fall    HPI Gary Gill is a 58 y.o. male.     Gary Gill is a 58 y.o. male with history of left eye blindness, who presents to the ED for evaluation after falling approximately 8 feet from a ladder while trimming branches.  Patient reports this occurred on the afternoon of 10/30, he waited to come in until late tonight due to continued pain in his neck, left shoulder and upper back.  Patient does report that he hit his head but does not think that he lost consciousness, reports mild headache.  No vomiting or vision changes.  He is not on any blood thinners.  Reports neck pain that radiates into both shoulders.  And he reports severe pins-and-needles-like pain in bilateral hands.  He denies numbness or decreased sensation, or any weakness and he is able to move both upper extremities with some discomfort.  Reports both hands feel hypersensitive.  He denies any pain in his chest or abdomen, no shortness of breath.  He reports some pain over the Left hip but has been ambulatory without difficulty.  Denies any other focal pain over his extremities.  No numbness weakness or tingling in the lower extremities, no loss of bowel or bladder control.  He has not taken anything for his symptoms prior to arrival.     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         Home Medications    Prior to Admission medications   Medication Sig Start Date End Date Taking? Authorizing Provider  acetaminophen (TYLENOL) 325 MG tablet Take 650 mg by  mouth every 6 (six) hours as needed. For pain    [provider]  cephALEXin (KEFLEX) 500 MG capsule Take 1 capsule (500 mg total) by mouth 3 (three) times daily. 12/14/18   Elvina Sidle, MD  chlorpheniramine-HYDROcodone (TUSSIONEX PENNKINETIC ER) 10-8 MG/5ML SUER Take 5 mLs by mouth every 12 (twelve) hours as needed for cough. 07/02/18   Arnaldo Natal, MD  ciprofloxacin (CIPRO) 500 MG tablet Take 1 tablet (500 mg total) 2 (two) times daily by mouth. 03/24/17   Loletta Specter, PA-C  clotrimazole (LOTRIMIN) 1 % cream Apply 1 application 2 (two) times daily topically. 03/23/17   Loletta Specter, PA-C  methocarbamol (ROBAXIN) 500 MG tablet Take 1 tablet (500 mg total) by mouth 2 (two) times daily. 03/17/19   Dartha Lodge, PA-C  methylPREDNISolone (MEDROL DOSEPAK) 4 MG TBPK tablet Take as directed 03/17/19   Dartha Lodge, PA-C  naproxen (NAPROSYN) 500 MG tablet Take 1 tablet (500 mg total) 2 (two) times daily with a meal by mouth. 03/23/17   Loletta Specter, PA-C  oseltamivir (TAMIFLU) 75 MG capsule Take 1 capsule (75 mg total) by mouth every 12 (twelve) hours. 07/02/18   Arnaldo Natal, MD  oxyCODONE-acetaminophen (PERCOCET) 5-325 MG tablet Take 1 tablet by mouth every 6 (six) hours as needed. 03/17/19   Jodi Geralds  N, PA-C  tamsulosin (FLOMAX) 0.4 MG CAPS capsule Take 1 capsule (0.4 mg total) daily by mouth. 04/19/17   Clent Demark, PA-C    Family History Family History  Problem Relation Age of Onset   Healthy Father     Social History Social History   Tobacco Use   Smoking status: Current Every Day Smoker   Smokeless tobacco: Never Used  Substance Use Topics   Alcohol use: Yes    Comment: daily   Drug use: Yes    Types: Marijuana, Cocaine     Allergies   Patient has no known allergies.   Review of Systems Review of Systems  Constitutional: Negative for chills, fatigue and fever.  HENT: Negative for congestion, ear pain, facial swelling,  rhinorrhea, sore throat and trouble swallowing.   Eyes: Negative for photophobia, pain and visual disturbance.  Respiratory: Negative for chest tightness and shortness of breath.   Cardiovascular: Negative for chest pain and palpitations.  Gastrointestinal: Negative for abdominal distention, abdominal pain, nausea and vomiting.  Genitourinary: Negative for difficulty urinating and hematuria.  Musculoskeletal: Positive for arthralgias, back pain, myalgias and neck pain. Negative for joint swelling.  Skin: Negative for rash and wound.  Neurological: Positive for headaches. Negative for dizziness, seizures, syncope, weakness, light-headedness and numbness.       Paresthesias     Physical Exam Updated Vital Signs BP 135/74 (BP Location: Right Arm)    Pulse 82    Temp 97.7 F (36.5 C) (Oral)    Resp 18    SpO2 100%   Physical Exam Vitals signs and nursing note reviewed.  Constitutional:      General: He is not in acute distress.    Appearance: Normal appearance. He is well-developed and normal weight. He is not ill-appearing or diaphoretic.     Comments: Patient appears uncomfortable but is in no acute distress.  HENT:     Head: Normocephalic.     Comments: Mild tenderness to palpation over the posterior scalp but no palpable hematoma or deformity.  Negative battle sign, no hemotympanum or CSF otorrhea.    Left Ear: Tympanic membrane normal.     Nose: Nose normal.     Mouth/Throat:     Mouth: Mucous membranes are moist.     Pharynx: Oropharynx is clear.  Eyes:     General:        Right eye: No discharge.        Left eye: No discharge.     Comments: Left eye prosthesis, right eye with reactive pupil, extraocular movements intact.  Neck:     Musculoskeletal: Neck supple. Muscular tenderness present.     Comments: There is midline tenderness over the C-spine without palpable deformity or crepitus, patient placed in c-collar during the exam. Cardiovascular:     Rate and Rhythm: Normal  rate and regular rhythm.     Heart sounds: Normal heart sounds.  Pulmonary:     Effort: Pulmonary effort is normal. No respiratory distress.     Breath sounds: Normal breath sounds. No wheezing or rales.     Comments: Respirations equal and unlabored, patient able to speak in full sentences, lungs clear to auscultation bilaterally, chest wall NTTP, no palpable deformity or crepitus, no ecchymosis Abdominal:     General: Bowel sounds are normal. There is no distension.     Palpations: Abdomen is soft. There is no mass.     Tenderness: There is no abdominal tenderness. There is no guarding.  Comments: No ecchymosis, Abdomen soft, nondistended, nontender to palpation in all quadrants without guarding or peritoneal signs, no CVA tenderness bilaterally  Musculoskeletal:        General: No deformity.     Comments: Tenderness over bilateral shoulder without palpable deformity, tenderness over upper back musculature without midline thoracic spine tenderness, there is some mild midline lumbar tenderness without deformity or ecchymosis. There is some tenderness over the right hip, no shortening or rotation. No instability with palpation over the pelvis. All other joints supple, and easily movable, all compartments soft.  Skin:    General: Skin is warm and dry.     Capillary Refill: Capillary refill takes less than 2 seconds.  Neurological:     Mental Status: He is alert.     Coordination: Coordination normal.     Comments: Speech is clear, able to follow commands CN III-XII intact Normal strength in upper and lower extremities bilaterally including dorsiflexion and plantar flexion, strong and equal grip strength Sensation normal to light and sharp touch, bilateral hand with paresthesia, hypersensitive to touch Moves extremities without ataxia, coordination intact   Psychiatric:        Mood and Affect: Mood normal.        Behavior: Behavior normal.      ED Treatments / Results  Labs (all  labs ordered are listed, but only abnormal results are displayed) Labs Reviewed - No data to display  EKG None  Radiology Dg Chest 2 View  Result Date: 03/17/2019 CLINICAL DATA:  Fall from ladder, back and shoulder pain. EXAM: CHEST - 2 VIEW COMPARISON:  Chest x-ray dated 12/05/2010. FINDINGS: Heart size and mediastinal contours are within normal limits. Lungs are clear. No pleural effusion or pneumothorax is seen. No osseous fracture or dislocation is seen. IMPRESSION: No acute findings.  Lungs are clear. Electronically Signed   By: Bary Richard M.D.   On: 03/17/2019 09:23   Dg Lumbar Spine Complete  Result Date: 03/17/2019 CLINICAL DATA:  Fall from ladder, back pain. EXAM: LUMBAR SPINE - COMPLETE 4+ VIEW COMPARISON:  Plain film of the lumbar spine dated 06/19/2016. FINDINGS: Osseous alignment is stable. No fracture line or displaced fracture fragment is seen. No compression fracture deformity. Stable mild degenerative change within the mid and lower lumbar spine. Visualized paravertebral soft tissues are unremarkable. IMPRESSION: 1. No acute findings. No osseous fracture or dislocation. 2. Stable mild degenerative change within the mid and lower lumbar spine. Electronically Signed   By: Bary Richard M.D.   On: 03/17/2019 09:27   Dg Shoulder Right  Result Date: 03/17/2019 CLINICAL DATA:  Fall from ladder this afternoon. Shoulder and back pain. EXAM: RIGHT SHOULDER - 2+ VIEW COMPARISON:  None. FINDINGS: There is no evidence of fracture or dislocation. There is no evidence of arthropathy or other focal bone abnormality. Soft tissues are unremarkable. IMPRESSION: Negative. Electronically Signed   By: Bary Richard M.D.   On: 03/17/2019 09:21   Ct Head Wo Contrast  Result Date: 03/17/2019 CLINICAL DATA:  Head trauma.  Loss of balance and fell from ladder. EXAM: CT HEAD WITHOUT CONTRAST CT CERVICAL SPINE WITHOUT CONTRAST TECHNIQUE: Multidetector CT imaging of the head and cervical spine was  performed following the standard protocol without intravenous contrast. Multiplanar CT image reconstructions of the cervical spine were also generated. COMPARISON:  None. FINDINGS: CT HEAD FINDINGS Brain: No evidence of acute infarction, hemorrhage, hydrocephalus, extra-axial collection or mass lesion/mass effect. Vascular: No hyperdense vessel or unexpected calcification. Skull: Normal. Negative for  fracture or focal lesion. Sinuses/Orbits: No acute finding. Other: Evidence for left globe prosthesis. CT CERVICAL SPINE FINDINGS Alignment: Alignment is stable. Minimal retrolisthesis at C5-C6 is stable. Normal alignment at the cervicothoracic junction. Skull base and vertebrae: No acute fracture. No primary bone lesion or focal pathologic process. Soft tissues and spinal canal: No prevertebral fluid or swelling. No visible canal hematoma. Disc levels: Mild disc space narrowing at C4-C5. Mild disc space narrowing at C5-C6. Left foraminal narrowing at C4-C5. Cannot exclude central disc osteophyte complex at C4-C5. Bilateral foraminal narrowing at C5-C6. Evidence for disc osteophyte complex at C5-C6. Upper chest: Negative. Other: None IMPRESSION: 1. No acute intracranial abnormality. 2. No acute abnormality in the cervical spine. 3. Degenerative changes in the cervical spine particularly at C5-C6. Electronically Signed   By: Richarda Overlie M.D.   On: 03/17/2019 08:59   Ct Cervical Spine Wo Contrast  Result Date: 03/17/2019 CLINICAL DATA:  Head trauma.  Loss of balance and fell from ladder. EXAM: CT HEAD WITHOUT CONTRAST CT CERVICAL SPINE WITHOUT CONTRAST TECHNIQUE: Multidetector CT imaging of the head and cervical spine was performed following the standard protocol without intravenous contrast. Multiplanar CT image reconstructions of the cervical spine were also generated. COMPARISON:  None. FINDINGS: CT HEAD FINDINGS Brain: No evidence of acute infarction, hemorrhage, hydrocephalus, extra-axial collection or mass  lesion/mass effect. Vascular: No hyperdense vessel or unexpected calcification. Skull: Normal. Negative for fracture or focal lesion. Sinuses/Orbits: No acute finding. Other: Evidence for left globe prosthesis. CT CERVICAL SPINE FINDINGS Alignment: Alignment is stable. Minimal retrolisthesis at C5-C6 is stable. Normal alignment at the cervicothoracic junction. Skull base and vertebrae: No acute fracture. No primary bone lesion or focal pathologic process. Soft tissues and spinal canal: No prevertebral fluid or swelling. No visible canal hematoma. Disc levels: Mild disc space narrowing at C4-C5. Mild disc space narrowing at C5-C6. Left foraminal narrowing at C4-C5. Cannot exclude central disc osteophyte complex at C4-C5. Bilateral foraminal narrowing at C5-C6. Evidence for disc osteophyte complex at C5-C6. Upper chest: Negative. Other: None IMPRESSION: 1. No acute intracranial abnormality. 2. No acute abnormality in the cervical spine. 3. Degenerative changes in the cervical spine particularly at C5-C6. Electronically Signed   By: Richarda Overlie M.D.   On: 03/17/2019 08:59   Mr Cervical Spine W Or Wo Contrast  Result Date: 03/17/2019 CLINICAL DATA:  8 foot fall from ladder. Left shoulder and upper back pain. EXAM: MRI CERVICAL SPINE WITHOUT AND WITH CONTRAST TECHNIQUE: Multiplanar and multiecho pulse sequences of the cervical spine, to include the craniocervical junction and cervicothoracic junction, were obtained without and with intravenous contrast. CONTRAST:  75mL GADAVIST GADOBUTROL 1 MMOL/ML IV SOLN COMPARISON:  CT of the cervical spine 03/17/2019 FINDINGS: Alignment: Slight degenerative retrolisthesis is present at C3-4, C4-5, and C5-6. There is some straightening of the normal lumbar lordosis. Vertebrae: No acute fracture present. Chronic edematous endplate marrow changes are present at C4-5. Marrow signal and vertebral body heights are otherwise normal. No pathologic enhancement is present. Cord: Normal  signal is present in the cervical and upper thoracic spinal cord to the lowest imaged level, T1-2 Posterior Fossa, vertebral arteries, paraspinal tissues: Insert normal craniocervical Disc levels: C2-3: Asymmetric left-sided uncovertebral and facet hypertrophy is present. Moderate left and mild right foraminal stenosis is present. The central canal is patent. C3-4: A broad-based disc osteophyte complex is present. Uncovertebral and facet disease is present bilaterally. Severe left and moderate right foraminal stenosis is present. There is partial effacement of ventral CSF. C4-5: A  broad-based disc osteophyte complex is present. The central canal is narrowed to 7 mm. Uncovertebral and facet disease contribute to moderate foraminal stenosis bilaterally. C5-6: A broad-based disc osteophyte complex is present. Central canal is narrowed to 6.5 mm. Uncovertebral and facet disease contribute to severe foraminal stenosis bilaterally. C6-7: A broad-based disc osteophyte complex is present. Uncovertebral and facet disease contribute to moderate foraminal narrowing, left greater than right. C7-T1: Negative. IMPRESSION: 1. Moderate left and mild right foraminal stenosis at C2-3. 2. Severe left and moderate right foraminal stenosis at C3-4. 3. Moderate central and bilateral foraminal stenosis at C4-5. 4. Moderate central and bilateral foraminal stenosis at C5-6 5. Moderate foraminal narrowing bilaterally at C6-7 is worse on the left. 6. No focal cord signal abnormality. The central and foraminal stenoses increase the possibility for spinal cord or nerve trauma following injury. Electronically Signed   By: Marin Roberts M.D.   On: 03/17/2019 13:09   Dg Shoulder Left  Result Date: 03/17/2019 CLINICAL DATA:  58 year old male with fall and left shoulder pain. EXAM: LEFT SHOULDER - 2+ VIEW COMPARISON:  Chest radiograph dated 12/05/2010 FINDINGS: There is no acute fracture or dislocation. The bones are osteopenic. There is  elevation of the left humeral head suggestive of chronic rotator cuff injury. There is mild to moderate arthritic changes of the glenohumeral joint as well as irregularity and bone spurring at the left [facility] joint. The soft tissues are unremarkable. IMPRESSION: 1. No acute fracture or dislocation. 2. Osteopenia. 3. Osteoarthritis of the left shoulder with elevation of the humeral head suggestive of chronic rotator cuff injury. Electronically Signed   By: Elgie Collard M.D.   On: 03/17/2019 00:50   Dg Hand Complete Left  Result Date: 03/17/2019 CLINICAL DATA:  Pt states he slipped off a ladder approx 8", pain to bilateral hands. No prior injuries or surgeries to hands EXAM: LEFT HAND - COMPLETE 3+ VIEW COMPARISON:  None. FINDINGS: No fracture or bone lesion. Joints normally spaced and aligned. No significant arthropathic change. Soft tissues are unremarkable. IMPRESSION: Negative. Electronically Signed   By: Amie Portland M.D.   On: 03/17/2019 10:31   Dg Hand Complete Right  Result Date: 03/17/2019 CLINICAL DATA:  Pt states he slipped off a ladder approx 8", pain to bilateral hands. No prior injuries or surgeries to hands EXAM: RIGHT HAND - COMPLETE 3+ VIEW COMPARISON:  None. FINDINGS: No fracture or bone lesion. Joints are normally spaced and aligned. Soft tissues are unremarkable. IMPRESSION: Negative. Electronically Signed   By: Amie Portland M.D.   On: 03/17/2019 10:30   Dg Hip Unilat W Or Wo Pelvis 2-3 Views Right  Result Date: 03/17/2019 CLINICAL DATA:  Fall from ladder, RIGHT hip pain. EXAM: DG HIP (WITH OR WITHOUT PELVIS) 2-3V RIGHT COMPARISON:  None. FINDINGS: Osseous alignment is normal. No fracture line or displaced fracture fragment seen. RIGHT femoral head is appropriately positioned relative to the acetabulum. No significant degenerative change at either hip joint. Mild degenerative spurring within the lower lumbar spine. Soft tissues about the pelvis and RIGHT hip are unremarkable.  IMPRESSION: No acute findings. No osseous fracture or dislocation. Electronically Signed   By: Bary Richard M.D.   On: 03/17/2019 09:25    Procedures Procedures (including critical care time)  Medications Ordered in ED Medications  oxyCODONE-acetaminophen (PERCOCET/ROXICET) 5-325 MG per tablet 1 tablet (1 tablet Oral Given 03/17/19 0814)  morphine 4 MG/ML injection 4 mg (4 mg Intravenous Given 03/17/19 1031)  ondansetron (ZOFRAN) injection 4 mg (4  mg Intravenous Given 03/17/19 1031)  LORazepam (ATIVAN) injection 1 mg (1 mg Intravenous Given 03/17/19 1228)  gadobutrol (GADAVIST) 1 MMOL/ML injection 7 mL (7 mLs Intravenous Contrast Given 03/17/19 1248)  methocarbamol (ROBAXIN) tablet 500 mg (500 mg Oral Given 03/17/19 1341)  predniSONE (DELTASONE) tablet 60 mg (60 mg Oral Given 03/17/19 1344)  oxyCODONE-acetaminophen (PERCOCET/ROXICET) 5-325 MG per tablet 1 tablet (1 tablet Oral Given 03/17/19 1431)     Initial Impression / Assessment and Plan / ED Course  I have reviewed the triage vital signs and the nursing notes.  Pertinent labs & imaging results that were available during my care of the patient were reviewed by me and considered in my medical decision making (see chart for details).  58 year old male presents to the ED several hours after he fell 8 feet from a ladder after losing his balance.  Despite severity of mechanism patient has been ambulatory and is overall well-appearing with stable vitals.  Patient complaining of severe pain in the neck radiating into both shoulders with paresthesia-like pain in both hands.  Patient was placed in c-collar immediately after evaluation.  Patient does not have tenderness over the chest or abdomen.  Has some tenderness over the right hand and mild tenderness over the low back.  Will get CT of the head and cervical spine, as well as x-rays of the chest, bilateral shoulders, bilateral hands, right hip and low back.  Initial imaging is reassuring, CT  of the head is unremarkable and CT of the C-spine shows degenerative changes but no acute fracture or malalignment, but patient continues to complain of severe paresthesia in the hands bilaterally.  X-rays of bilateral hands and shoulders are unremarkable.  X-rays of the pelvis and right hip are unremarkable. Lumbar films without acute fracture. Will get MRI of the C-spine given persistent paresthesia. Pain treated in ED. Patient is ambulatory to bathroom without assistance.  MRI shows significant degeneration changes with both canal and foraminal stenosis but no evidence of fracture, and no abnormal cord signals. Given persistent paresthesia, case discussed with NP Leo GrosserKim Meyran with Neurosurgery who feels this is likely due to nerve inflammation from injury in setting of chronic degenerative.  Neurosurgery recommends course of steroids to help improve inflammation, and outpatient follow-up if symptoms or not improving.  I discussed this with the patient, will treat with steroids, pain medication, and muscle relaxers, discussed follow up. Pt expresses understanding and agrees with plan.   Final Clinical Impressions(s) / ED Diagnoses   Final diagnoses:  Fall from ladder, initial encounter  Neck pain  Paresthesia  Musculoskeletal pain    ED Discharge Orders         Ordered    oxyCODONE-acetaminophen (PERCOCET) 5-325 MG tablet  Every 6 hours PRN     03/17/19 1428    methylPREDNISolone (MEDROL DOSEPAK) 4 MG TBPK tablet     03/17/19 1428    methocarbamol (ROBAXIN) 500 MG tablet  2 times daily     03/17/19 [address]1428           Llewyn Heap N, [place] JerseyPA-C 03/21/19 2209    Margarita Grizzleay, Danielle, MD 03/26/19 [phone]

## 2019-03-17 NOTE — ED Notes (Signed)
Patient transported to X-ray 

## 2019-03-17 NOTE — ED Notes (Signed)
Pt hands and feet are swollen. Pt states he can not feel his hand and feet.

## 2019-03-17 NOTE — ED Notes (Signed)
Patient transported to CT 

## 2019-03-17 NOTE — ED Triage Notes (Signed)
Patient lost his balance and fell from a ladder approx.8" high while cutting branches this afternoon , ambulatory/respirations unlabored , alert and oriented , reports left shoulder and upper back pain .

## 2019-03-17 NOTE — ED Notes (Signed)
Patient transported to MRI 

## 2019-03-22 ENCOUNTER — Other Ambulatory Visit: Payer: Self-pay | Admitting: Neurological Surgery

## 2019-04-09 ENCOUNTER — Inpatient Hospital Stay (HOSPITAL_COMMUNITY): Admission: RE | Admit: 2019-04-09 | Payer: PRIVATE HEALTH INSURANCE | Source: Ambulatory Visit

## 2019-04-11 ENCOUNTER — Encounter (HOSPITAL_COMMUNITY): Admission: RE | Payer: Self-pay | Source: Home / Self Care

## 2019-04-11 ENCOUNTER — Inpatient Hospital Stay (HOSPITAL_COMMUNITY): Admission: RE | Admit: 2019-04-11 | Payer: Medicaid Other | Source: Home / Self Care | Admitting: Neurological Surgery

## 2019-04-11 SURGERY — ANTERIOR CERVICAL DECOMPRESSION/DISCECTOMY FUSION 3 LEVELS
Anesthesia: General

## 2019-05-18 DEATH — deceased

## 2020-04-18 IMAGING — CT CT CERVICAL SPINE W/O CM
3 of 4 series · 11 of 33 positions shown, 13 images · non-contrast
Comparison: None.

CLINICAL DATA: Head trauma.  Loss of balance and fell from ladder.

EXAM:
CT HEAD WITHOUT CONTRAST
CT CERVICAL SPINE WITHOUT CONTRAST
TECHNIQUE: Multidetector CT imaging of the head and cervical spine was
performed following the standard protocol without intravenous
contrast. Multiplanar CT image reconstructions of the cervical spine
were also generated.

[Series 8: sag bone · sagittal · 0.24mm/px · 5 of 72 slices shown, 6 images]
[im 24/72  bone]
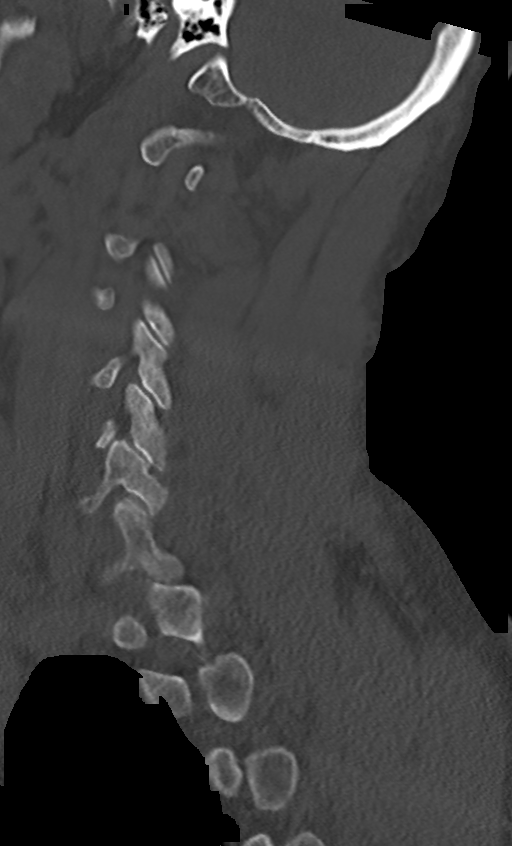
[im 30/72  bone]
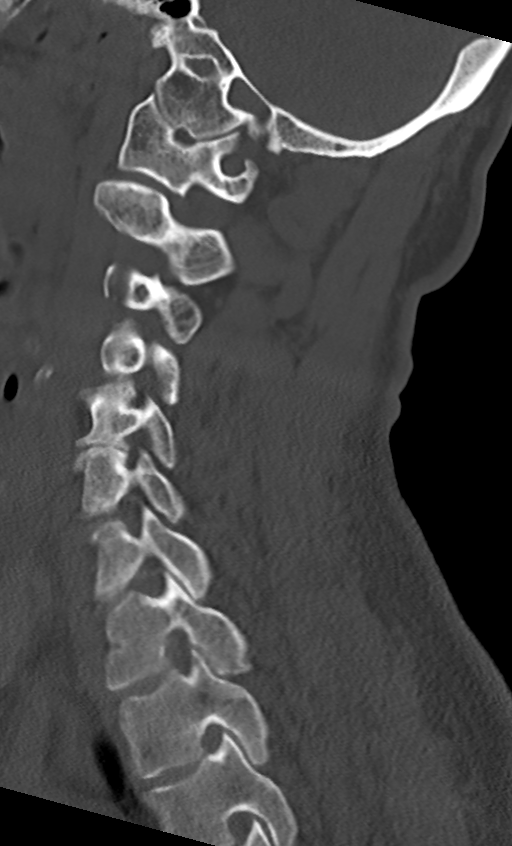
[im 36/72  soft-tissue]
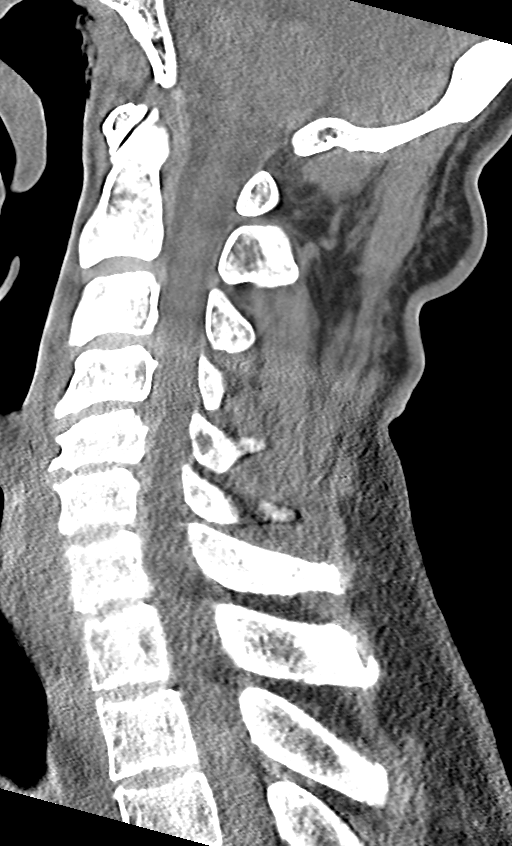
[im 36/72  bone]
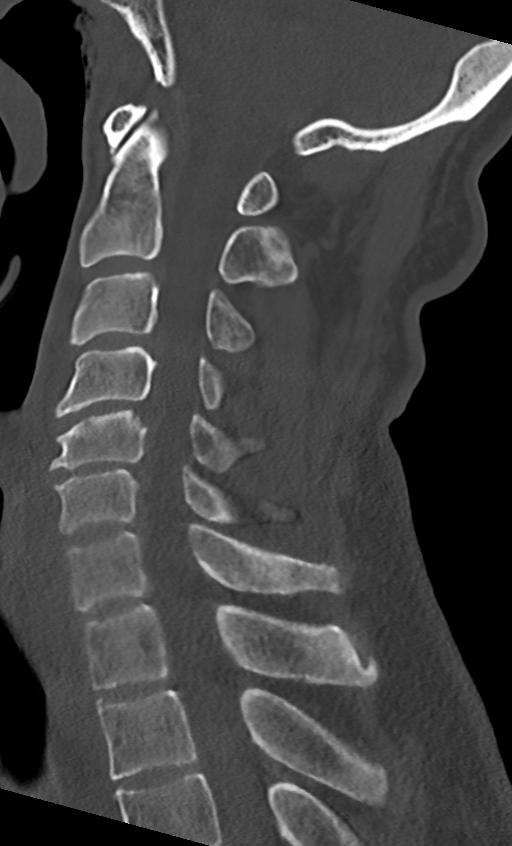
[im 42/72  bone]
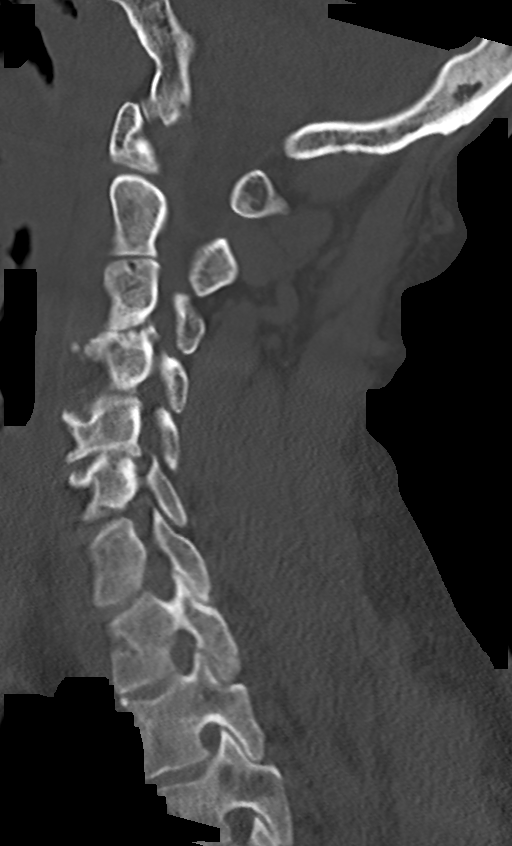
[im 48/72  bone]
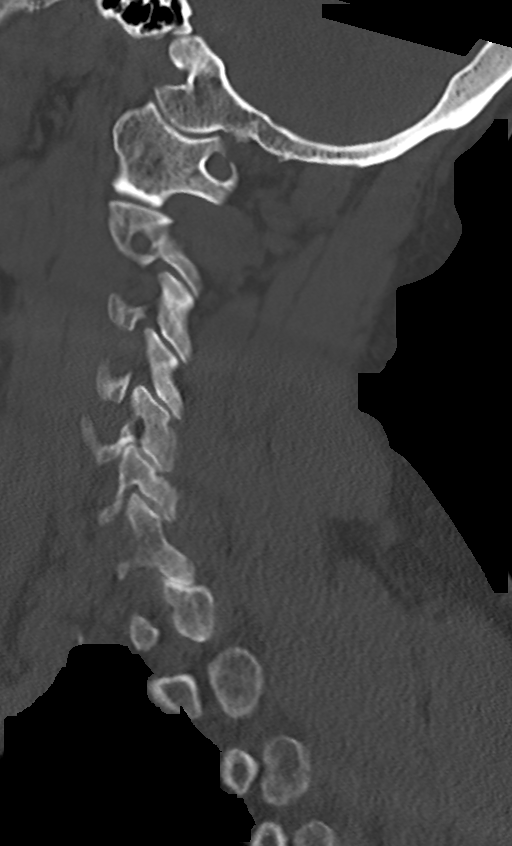

[Series 9: cor bone · coronal · 0.25mm/px · 3 of 64 slices shown]
[im 13/64  bone]
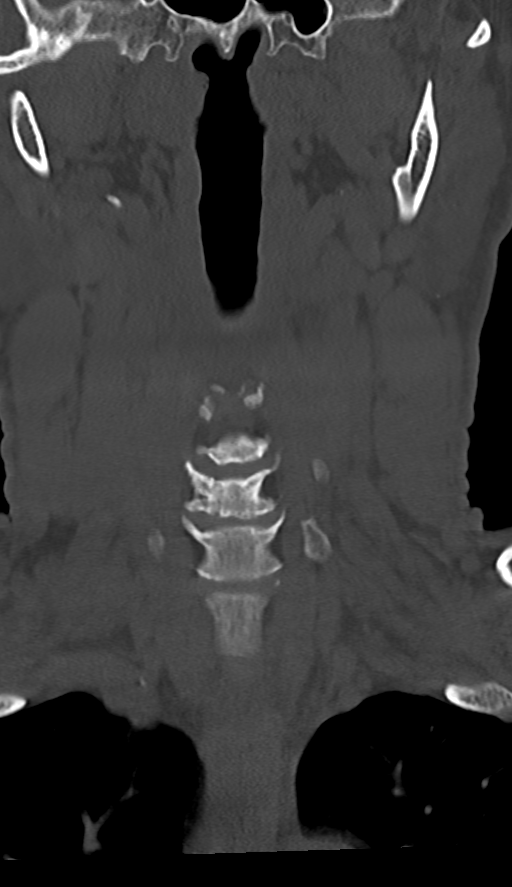
[im 26/64  bone]
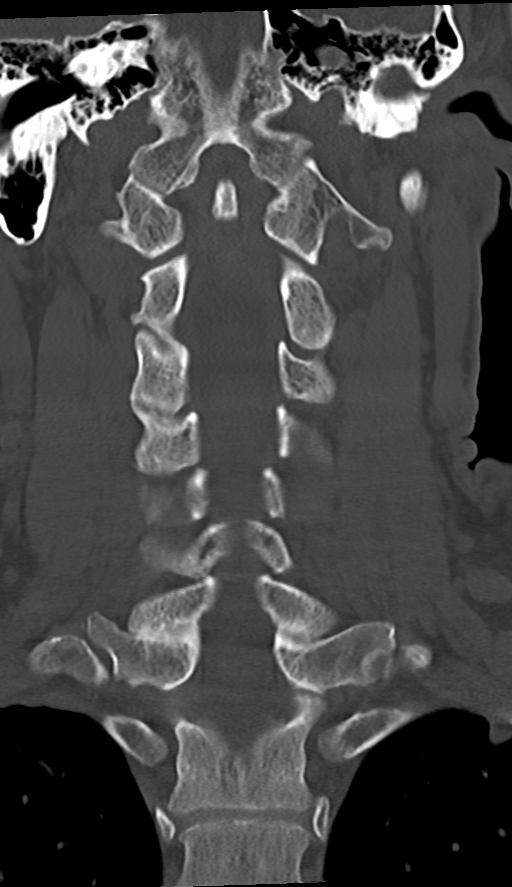
[im 38/64  bone]
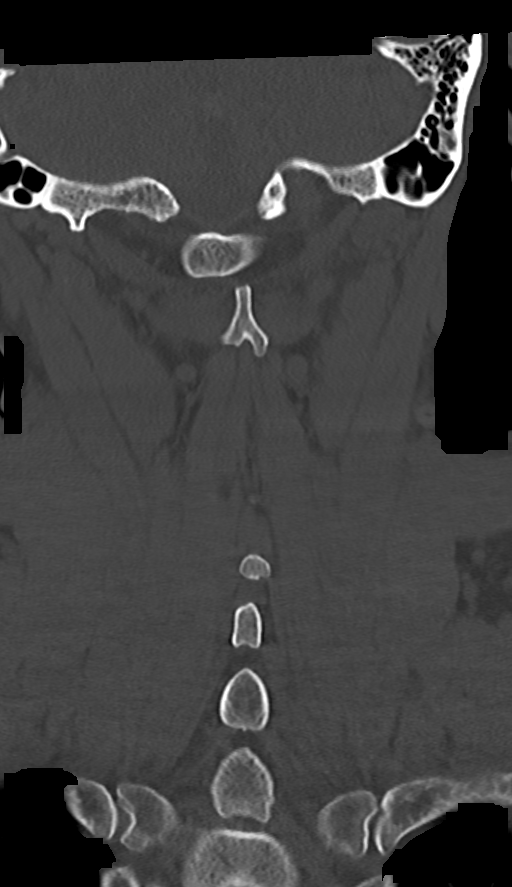

[Series 10: orthogonal axials · axial · 0.21mm/px · z∈[-309,-183]mm · 3 of 99 slices shown, 4 images]
[im 17/99  soft-tissue]
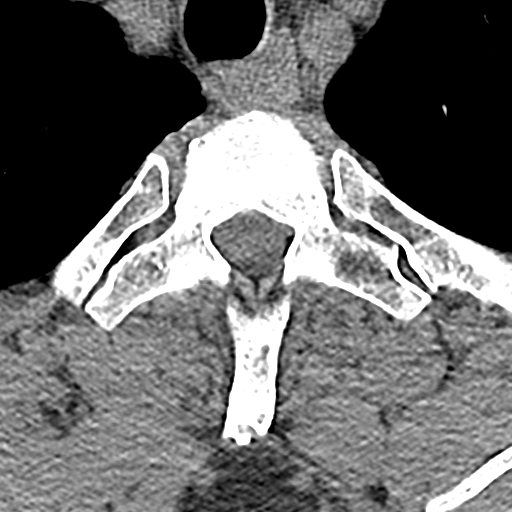
[im 17/99  bone]
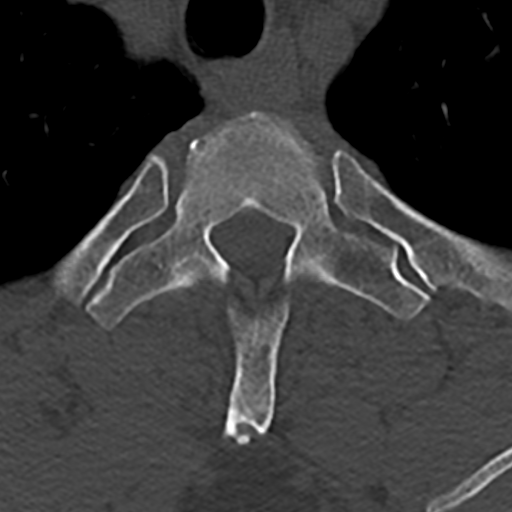
[im 50/99  bone]
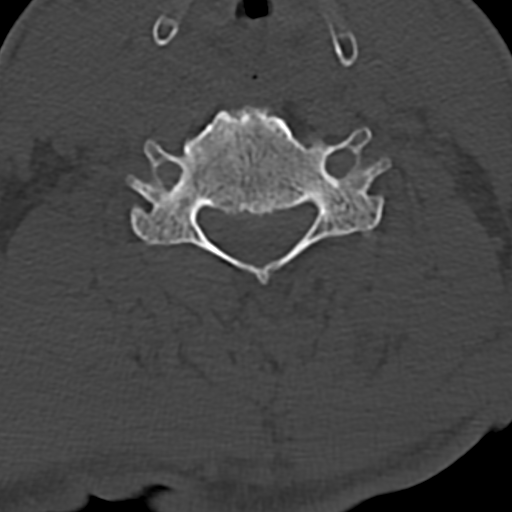
[im 82/99  bone]
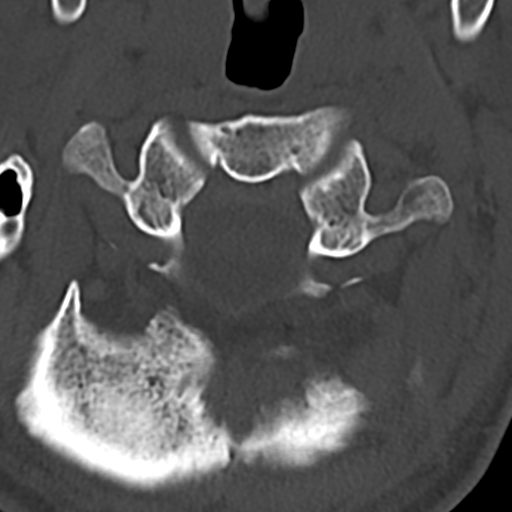

[11 of 33 positions shown; findings below may reference images not displayed]

FINDINGS: CT HEAD FINDINGS

Brain: No evidence of acute infarction, hemorrhage, hydrocephalus,
extra-axial collection or mass lesion/mass effect.

Vascular: No hyperdense vessel or unexpected calcification.

Skull: Normal. Negative for fracture or focal lesion.

Sinuses/Orbits: No acute finding.

Other: Evidence for left globe prosthesis.

CT CERVICAL SPINE FINDINGS

Alignment: Alignment is stable. Minimal retrolisthesis at C5-C6 is
stable. Normal alignment at the cervicothoracic junction.

Skull base and vertebrae: No acute fracture. No primary bone lesion
or focal pathologic process.

Soft tissues and spinal canal: No prevertebral fluid or swelling. No
visible canal hematoma.

Disc levels: Mild disc space narrowing at C4-C5. Mild disc space
narrowing at C5-C6. Left foraminal narrowing at C4-C5. Cannot
exclude central disc osteophyte complex at C4-C5. Bilateral
foraminal narrowing at C5-C6. Evidence for disc osteophyte complex
at C5-C6.

Upper chest: Negative.

Other: None
IMPRESSION: 1. No acute intracranial abnormality.
2. No acute abnormality in the cervical spine.
3. Degenerative changes in the cervical spine particularly at C5-C6.
